# Patient Record
Sex: Female | Born: 1955 | Race: White | Hispanic: No | Marital: Married | Smoking: Never smoker
Health system: Southern US, Community
[De-identification: ages and names within clinical notes are randomized; demographics above are authoritative.]

## PROBLEM LIST (undated history)

## (undated) DIAGNOSIS — Z8489 Family history of other specified conditions: Secondary | ICD-10-CM

## (undated) DIAGNOSIS — K5790 Diverticulosis of intestine, part unspecified, without perforation or abscess without bleeding: Secondary | ICD-10-CM

## (undated) DIAGNOSIS — I6523 Occlusion and stenosis of bilateral carotid arteries: Secondary | ICD-10-CM

## (undated) DIAGNOSIS — Q2112 Patent foramen ovale: Secondary | ICD-10-CM

## (undated) DIAGNOSIS — K5792 Diverticulitis of intestine, part unspecified, without perforation or abscess without bleeding: Secondary | ICD-10-CM

## (undated) DIAGNOSIS — Z86718 Personal history of other venous thrombosis and embolism: Secondary | ICD-10-CM

## (undated) DIAGNOSIS — M1710 Unilateral primary osteoarthritis, unspecified knee: Secondary | ICD-10-CM

## (undated) DIAGNOSIS — K219 Gastro-esophageal reflux disease without esophagitis: Secondary | ICD-10-CM

## (undated) DIAGNOSIS — E782 Mixed hyperlipidemia: Secondary | ICD-10-CM

## (undated) DIAGNOSIS — N200 Calculus of kidney: Secondary | ICD-10-CM

## (undated) DIAGNOSIS — U071 COVID-19: Secondary | ICD-10-CM

## (undated) DIAGNOSIS — M179 Osteoarthritis of knee, unspecified: Secondary | ICD-10-CM

## (undated) DIAGNOSIS — K8020 Calculus of gallbladder without cholecystitis without obstruction: Secondary | ICD-10-CM

## (undated) DIAGNOSIS — T7840XA Allergy, unspecified, initial encounter: Secondary | ICD-10-CM

## (undated) DIAGNOSIS — E7800 Pure hypercholesterolemia, unspecified: Secondary | ICD-10-CM

## (undated) DIAGNOSIS — Q211 Atrial septal defect: Secondary | ICD-10-CM

## (undated) DIAGNOSIS — Z87442 Personal history of urinary calculi: Secondary | ICD-10-CM

## (undated) DIAGNOSIS — E781 Pure hyperglyceridemia: Secondary | ICD-10-CM

## (undated) HISTORY — DX: Osteoarthritis of knee, unspecified: M17.9

## (undated) HISTORY — PX: WISDOM TOOTH EXTRACTION: SHX21

## (undated) HISTORY — DX: Atrial septal defect: Q21.1

## (undated) HISTORY — DX: Calculus of gallbladder without cholecystitis without obstruction: K80.20

## (undated) HISTORY — DX: Calculus of kidney: N20.0

## (undated) HISTORY — DX: Personal history of other venous thrombosis and embolism: Z86.718

## (undated) HISTORY — DX: Patent foramen ovale: Q21.12

## (undated) HISTORY — PX: ABDOMINAL HYSTERECTOMY: SHX81

## (undated) HISTORY — DX: Personal history of transient ischemic attack (TIA), and cerebral infarction without residual deficits: Z86.73

## (undated) HISTORY — DX: Pure hyperglyceridemia: E78.1

## (undated) HISTORY — DX: Pure hypercholesterolemia, unspecified: E78.00

## (undated) HISTORY — DX: Diverticulosis of intestine, part unspecified, without perforation or abscess without bleeding: K57.90

## (undated) HISTORY — DX: Gastro-esophageal reflux disease without esophagitis: K21.9

## (undated) HISTORY — DX: Occlusion and stenosis of bilateral carotid arteries: I65.23

## (undated) HISTORY — DX: Allergy, unspecified, initial encounter: T78.40XA

## (undated) HISTORY — PX: TONSILLECTOMY: SUR1361

## (undated) HISTORY — DX: Mixed hyperlipidemia: E78.2

## (undated) HISTORY — DX: Unilateral primary osteoarthritis, unspecified knee: M17.10

## (undated) HISTORY — PX: CHOLECYSTECTOMY: SHX55

## (undated) HISTORY — DX: Diverticulitis of intestine, part unspecified, without perforation or abscess without bleeding: K57.92

## (undated) HISTORY — DX: COVID-19: U07.1

---
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
## 2001-01-30 DIAGNOSIS — N200 Calculus of kidney: Secondary | ICD-10-CM

## 2001-01-30 HISTORY — DX: Calculus of kidney: N20.0

## 2009-11-30 DIAGNOSIS — G459 Transient cerebral ischemic attack, unspecified: Secondary | ICD-10-CM | POA: Insufficient documentation

## 2009-12-14 ENCOUNTER — Encounter: Payer: Self-pay | Admitting: Cardiology

## 2009-12-19 ENCOUNTER — Encounter: Payer: Self-pay | Admitting: Cardiology

## 2009-12-20 ENCOUNTER — Encounter: Payer: Self-pay | Admitting: Cardiology

## 2009-12-22 ENCOUNTER — Encounter: Payer: Self-pay | Admitting: Cardiology

## 2009-12-24 ENCOUNTER — Encounter: Payer: Self-pay | Admitting: Cardiology

## 2009-12-29 ENCOUNTER — Encounter (INDEPENDENT_AMBULATORY_CARE_PROVIDER_SITE_OTHER): Payer: Self-pay | Admitting: *Deleted

## 2009-12-29 ENCOUNTER — Ambulatory Visit: Payer: Self-pay | Admitting: Cardiology

## 2009-12-29 ENCOUNTER — Encounter: Payer: Self-pay | Admitting: Cardiology

## 2009-12-29 DIAGNOSIS — R42 Dizziness and giddiness: Secondary | ICD-10-CM

## 2010-01-01 ENCOUNTER — Encounter: Payer: Self-pay | Admitting: Cardiology

## 2010-01-05 DIAGNOSIS — H53129 Transient visual loss, unspecified eye: Secondary | ICD-10-CM

## 2010-01-05 DIAGNOSIS — Z87442 Personal history of urinary calculi: Secondary | ICD-10-CM | POA: Insufficient documentation

## 2010-01-05 DIAGNOSIS — Z8673 Personal history of transient ischemic attack (TIA), and cerebral infarction without residual deficits: Secondary | ICD-10-CM

## 2010-01-06 ENCOUNTER — Ambulatory Visit: Payer: Self-pay | Admitting: Cardiology

## 2010-01-10 ENCOUNTER — Encounter: Payer: Self-pay | Admitting: Cardiology

## 2010-01-18 ENCOUNTER — Telehealth (INDEPENDENT_AMBULATORY_CARE_PROVIDER_SITE_OTHER): Payer: Self-pay | Admitting: *Deleted

## 2010-01-25 ENCOUNTER — Telehealth (INDEPENDENT_AMBULATORY_CARE_PROVIDER_SITE_OTHER): Payer: Self-pay | Admitting: *Deleted

## 2010-01-27 DIAGNOSIS — R002 Palpitations: Secondary | ICD-10-CM

## 2010-02-21 ENCOUNTER — Ambulatory Visit
Admission: RE | Admit: 2010-02-21 | Discharge: 2010-02-21 | Payer: Self-pay | Source: Home / Self Care | Attending: Physician Assistant | Admitting: Physician Assistant

## 2010-02-21 DIAGNOSIS — E785 Hyperlipidemia, unspecified: Secondary | ICD-10-CM | POA: Insufficient documentation

## 2010-02-26 ENCOUNTER — Encounter: Payer: Self-pay | Admitting: Cardiology

## 2010-03-01 NOTE — Procedures (Signed)
Summary: Holter and Event/ CARDIONET  Holter and Event/ CARDIONET   Imported By: Dorise Hiss 01/07/2010 16:02:46  _____________________________________________________________________  External Attachment:    Type:   Image     Comment:   External Document

## 2010-03-01 NOTE — Assessment & Plan Note (Signed)
Summary: 48 hour holter monitor  Nurse Visit  CC: holter monitor placed  Comments 48 hour holter monitor placed. patient c/o burning at the right clavicle area after electrodes placed. nurse removed and replaced in area to the right of first placement. Patient was able to tolerate this one. Nurse informed patient to use vaseline on area that was irritated and if reaction happened again,to disconnect monitor and return to our office. Instructions given and patient verbalized understanding of plan. patient instructed to return monitor without electrodes on Friday.   Orders Added: 1)  Holter Monitor [Holter Monitor]

## 2010-03-01 NOTE — Consult Note (Signed)
Summary: [facility] NEUROLOGIST [facility]  [facility] NEUROLOGIST [facility]   Imported By: Zachary George 01/05/2010 11:52:14  _____________________________________________________________________  External Attachment:    Type:   Image     Comment:   External Document

## 2010-03-01 NOTE — Letter (Signed)
Summary: [facility] D/C DR. Wende Crease  [facility] D/C DR. Wende Crease   Imported By: Zachary George 01/05/2010 11:48:13  _____________________________________________________________________  External Attachment:    Type:   Image     Comment:   External Document

## 2010-03-03 ENCOUNTER — Encounter (INDEPENDENT_AMBULATORY_CARE_PROVIDER_SITE_OTHER): Payer: Self-pay | Admitting: *Deleted

## 2010-03-03 NOTE — Assessment & Plan Note (Signed)
Summary: NPH-HOLTER F/U PER PARSON'S REQUEST   Visit Type:  Initial Consult Primary Dorman Calderwood:  Juanetta Snow, NP([facility])  CC:  wore 48 hour monitor for dizziness.  History of Present Illness: 55 yo presents for evaluation of multiple episodes of presumed TIAs.  Patient initially had an episode of vertigo on 11/3.  This was followed on 11/4 by an episode consistent with amaurosis fugax in the right eye.  On 11/21, she again had transient visual loss in the right eye and was admitted to [facility].  CT head was negative.  While in the hospital, she developed right-sided numbness and tingling.  FInally, on 11/30 she went to the [facility] ER with recurrent numbness in the right neck, arm, and hand.  She has been seen by [facility] neurology.  MRI head was unremarkable.  Echo per report showed a small PFO.  Holter monitor showed rare PACs and PVCs, no atrial fibrillation.  Patient denies exertional dyspnea or chest pain.  She does report occasional palpitations.  She occasionally feels fast, irregular heart beats.  She did not have any tachypalpitations while she was wearing the monitor.    ECG: NSR, normal  Labs (11/11): K 4.1, creatinine 0.69  Preventive Screening-Counseling & Management  Alcohol-Tobacco     Smoking Status: never  Current Medications (verified): 1)  Coumadin 5 Mg Tabs (Warfarin Sodium) .... Use As Directed 2)  Multivitamins  Tabs (Multiple Vitamin) .... Take 1 Tablet By Mouth Once A Day  Allergies: 1)  ! Pcn 2)  ! * Ivp Dye 3)  ! Codeine 4)  ! Morphine 5)  ! Adhesive Tape  Comments:  Nurse/Medical Assistant: The patient's medications and allergies were verbally reviewed with the patient and were updated in the Medication and Allergy Lists.  Past History:  Past Medical History: 1. TIAs: 4 possible TIAs in 11/11.  Worked up by [facility] neurology.  Apparently had echo with small PFO.  Venous ultrasound with no lower extremity DVT.  Holter (11/11): HR range  47-127, average 74, rare PACs and PVCs, no atrial fibrillation.   2. Kidney stones in 2003 3. Right knee osteoarthritis  Family History: Father: hx of DVTS-hx of HTN and CAD and MI in his 49s.   Social History: Works for the [employer] [place], [place].  Lives in [place] in [place].  2 adult children Married Tobacco Use - No.  Drug Use - no  Review of Systems       All systems reviewed and negative except as per HPI.   Vital Signs:  Patient profile:   55 year old female Height:      62 inches Weight:      140 pounds BMI:     25.70 Pulse rate:   78 / minute BP sitting:   124 / 83  (left arm) Cuff size:   regular  Vitals Entered By: Carlye Grippe (January 06, 2010 1:15 PM)  Nutrition Counseling: Patient's BMI is greater than 25 and therefore counseled on weight management options. CC: wore 48 hour monitor for dizziness   Physical Exam  General:  Well developed, well nourished, in no acute distress. Head:  normocephalic and atraumatic Nose:  no deformity, discharge, inflammation, or lesions Mouth:  Teeth, gums and palate normal. Oral mucosa normal. Neck:  Neck supple, no JVD. No masses, thyromegaly or abnormal cervical nodes. Lungs:  Clear bilaterally to auscultation and percussion. Heart:  Non-displaced PMI, chest non-tender; regular rate and rhythm, S1, S2 without murmurs, rubs  or gallops. Carotid upstroke normal, no bruit.  Pedals normal pulses. No edema, no varicosities. Abdomen:  Bowel sounds positive; abdomen soft and non-tender without masses, organomegaly, or hernias noted. No hepatosplenomegaly. Extremities:  No clubbing or cyanosis. Neurologic:  Alert and oriented x 3. Skin:  Intact without lesions or rashes. Psych:  Normal affect.   Impression & Recommendations:  Problem # 1:  TIA (ICD-435.9) Patient has had multiple episodes consistent with TIAs in 11/11.  Differential also includes atypical migraine.  She is being worked up by neurology at [facility]  and was found to have a small PFO on echo.  Lower extremity doppler ultrasound has shown no DVT.  She had a holter monitor for 48 hours that showed no atrial fibrillation but she did not have any of her tachypalpitations while wearing the monitor.  She says that she is scheduled to get a carotid US at [facility].  Given her history of tachypalpitations and stroke at a young age, I feel like a more definitive evaluation for atrial fibrillation is needed.  I will have her do a 3 week event monitor.  I will also check her lipids to make sure that these are under control.  I would agree with coumadin therapy.  There is no evidence for closure of the PFO outside a clincal trial.    Other Orders: T-Lipid Profile ([id number]) Cardionet/Event Monitor (Cardionet/Event)  Patient Instructions: 1)  Follow up with Gene Serpe, PA-C on Monday, February 07, 2010 at 10:20am. 2)  Have fasting lab work done at primary MD's office. Make sure they fax labs to our office for review. Do not eat or drink after midnight. 3)  We will call [facility] to request a copy of your Echo.  4)  Your physician has recommended that you wear an event monitor.  Event monitors are medical devices that record the heart's electrical activity. Doctors most often use these monitors to diagnose arrhythmias. Arrhythmias are problems with the speed or rhythm of the heartbeat. The monitor is a small, portable device. You can wear one while you do your normal daily activities. This is usually used to diagnose what is causing palpitations/syncope (passing out).

## 2010-03-03 NOTE — Assessment & Plan Note (Signed)
Summary: 1 MO F/U PER 12/8 OV W/DR. MCLEAN-JM   Visit Type:  Follow-up Primary Provider:  Juanetta Snow, NP([facility])   History of Present Illness: patient returns for a scheduled followup, following recent initial evaluation, by Dr. Shirlee Latch, back in early December. She was initially referred to [place] for further cardiac evaluation, and context of multiple episodes of presumed TIAs.  Dr. Lucien Mons recommended further evaluation with a 21 day event monitor, which was just completed last week. Strips have been reviewed, and are negative for any evidence of fibrillation/flutter. An isolated PVC is noted. Of note, the patient did have occasional palpitations, during this time frame. She has not had recurrent neurologic events, since her last OV. She is being followed by a neurologist at [facility].  A review of her previous echo at [facility] was also requested, and this indicated normal LVEF (EF greater than 55%), with no LVH, normal RVF, and no significant valvular disease. There was evidence of intra atrial flow communication, with saline contrast.   Patient is on chronic Coumadin, followed by her primary M.D. She has also since been placed on Lipitor, with recent total cholesterol 209, HDL 61, and LDL 107.  Preventive Screening-Counseling & Management  Alcohol-Tobacco     Smoking Status: never  Current Medications (verified): 1)  Coumadin 7.5 Mg Tabs (Warfarin Sodium) .... Use As Directed 2)  Multivitamins  Tabs (Multiple Vitamin) .... Take 1 Tablet By Mouth Once A Day 3)  Lipitor 20 Mg Tabs (Atorvastatin Calcium) .... Take 1 Tablet By Mouth Once A Day  Allergies (verified): 1)  ! Pcn 2)  ! * Ivp Dye 3)  ! Codeine 4)  ! Morphine 5)  ! Adhesive Tape  Comments:  Nurse/Medical Assistant: The patient's medications and allergies were reviewed with the patient and were updated in the Medication and Allergy Lists.  Tammi Romine CMA (February 21, 2010 10:03 AM)  Past History:  Past  Medical History: Last updated: 01/06/2010 1. TIAs: 4 possible TIAs in 11/11.  Worked up by [facility] neurology.  Apparently had echo with small PFO.  Venous ultrasound with no lower extremity DVT.  Holter (11/11): HR range 47-127, average 74, rare PACs and PVCs, no atrial fibrillation.   2. Kidney stones in 2003 3. Right knee osteoarthritis  Review of Systems       No fevers, chills, hemoptysis, dysphagia, melena, hematocheezia, hematuria, rash, claudication, orthopnea, pnd, pedal edema. All other systems negative.   Vital Signs:  Patient profile:   55 year old female Height:      62 inches Weight:      143 pounds BMI:     26.25 Pulse rate:   68 / minute BP sitting:   119 / 74  (left arm) Cuff size:   regular  Vitals Entered By: Fuller Plan CMA (February 21, 2010 10:03 AM)  Physical Exam  Additional Exam:  GEN: 55 year old female, no distress HEENT: NCAT,PERRLA,EOMI NECK: palpable pulses, no bruits; no JVD; no TM LUNGS: CTA bilaterally HEART: RRR (S1S2); no significant murmurs; no rubs; no gallops ABD: soft, NT; intact BS EXT: intact distal pulses; no edema SKIN: warm, dry MUSC: no obvious deformity NEURO: A/O (x3)     Impression & Recommendations:  Problem # 1:  TIA (ICD-435.9)  no definite evidence of atrial dysrhythmias, per recent 21 day event monitor. No further cardiac workup indicated, this point in time. Patient is to remain on Coumadin, followed by her primary care team, as well as her neurologist at [facility].  Of note, there is no current indication for surgical treatment of the noted PFO, as recently indicated by Dr. Shirlee Latch. We'll continue to monitor closely.  Problem # 2:  DYSLIPIDEMIA (ICD-272.4)  patient currently on Lipitor, followed by primary team. Would try to maintain an LDL target of less than 100, in the absence of documented CAD.  Patient Instructions: 1)  Your physician wants you to follow-up in: 6 months. You will receive a reminder letter in the mail  one-two months in advance. If you don't receive a letter, please call our office to schedule the follow-up appointment. Earnestine Leys) 2)  Your physician recommends that you continue on your current medications as directed. Please refer to the Current Medication list given to you today.

## 2010-03-03 NOTE — Progress Notes (Signed)
Summary: CARDIONET MONITOR STILL HAS ARRIVED TO PATIENT  Phone Note Call from Patient Call back at Home Phone [phone]   Caller: Patient Call For: nurse Summary of Call: patient called and said she still hasn't recieved her cardionet monitor. nurse informed patient that she should call cardionet directly and informed them of this and also nurse would be informed upon her arrival to office tomorrow about this issue. Initial call taken by: Carlye Grippe,  January 25, 2010 9:10 AM  Follow-up for Phone Call        Received notification from [employer] via fax that monitor was returned w/no explanation from pt. Pt states she still has not received monitor. Spoke with [employer] who states monitor was sent and then received back to them. [employer] states will check with distribution center to find error and new monitor will be sent to pt. Pt is aware of this plan. She will notify office if she does not receive monitor in next few days. Pt has f/u appt scheduled with Gene for 1/9. This was r/s today to 1/23 d/t delay in receiving monitor. Follow-up by: Cyril Loosen, RN, BSN,  January 26, 2010 10:12 AM

## 2010-03-03 NOTE — Progress Notes (Signed)
Summary: Monitor Question  Phone Note Call from Patient Call back at Home Phone [phone]   Summary of Call: Pt has not received heart monitor that was ordered on 12/9. She spoke with someone from the [employer] but has not received the monitor. Spoke with [employer]. Rep was unable to track with [employer]. She was going to contact distributor and have monitor over-nighted to pt if it had not been sent. Pt notified and verbalized understanding.  Initial call taken by: Cyril Loosen, RN, BSN,  January 18, 2010 4:11 PM

## 2010-03-09 NOTE — Procedures (Signed)
Summary: Holter and Event/ CARDIONET  Holter and Event/ CARDIONET   Imported By: Dorise Hiss 03/03/2010 10:55:56  _____________________________________________________________________  External Attachment:    Type:   Image     Comment:   External Document  Appended Document: Holter and Event/ CARDIONET no recurrence of atrial fibrillation maintaining normal sinus rhythm.  Continue current medical therapy  Appended Document: Holter and Event/ CARDIONET Patient notified by letter.

## 2010-03-09 NOTE — Letter (Signed)
Summary: Engineer, materials at [facility]  [address]   [place], [place] [address]   Phone: [phone]  Fax: [phone]        March 03, 2010 MRN: [id number]   CARMILLA GRANVILLE [address] [place], [place]  [address]   Dear Ms. Busenbark,  Your test ordered by Selena Batten has been reviewed by your physician (or physician assistant) and was found to be normal or stable. Your physician (or physician assistant) felt no changes were needed at this time.  ____ Echocardiogram  ____ Cardiac Stress Test  ____ Lab Work  ____ Peripheral vascular study of arms, legs or neck  ____ CT scan or X-ray  ____ Lung or Breathing test  __X__ Other:  Event monitor - no recurrence of atrial fibrillation.  Maintaining normal sinus rhythm, continue current medical therapy.   Thank you.   Hoover Brunette, LPN    Duane Boston, M.D., F.A.C.C. Thressa Sheller, M.D., F.A.C.C. Oneal Grout, M.D., F.A.C.C. Cheree Ditto, M.D., F.A.C.C. Daiva Nakayama, M.D., F.A.C.C. Kenney Houseman, M.D., F.A.C.C. Jeanne Ivan, PA-C

## 2010-12-09 HISTORY — PX: SHOULDER ARTHROSCOPY: SHX128

## 2011-02-20 ENCOUNTER — Ambulatory Visit: Payer: BC Managed Care – PPO | Admitting: Cardiology

## 2011-02-22 ENCOUNTER — Encounter: Payer: Self-pay | Admitting: *Deleted

## 2011-02-27 ENCOUNTER — Ambulatory Visit (INDEPENDENT_AMBULATORY_CARE_PROVIDER_SITE_OTHER): Payer: BC Managed Care – PPO | Admitting: Cardiology

## 2011-02-27 ENCOUNTER — Encounter: Payer: Self-pay | Admitting: *Deleted

## 2011-02-27 VITALS — BP 129/84 | HR 80 | Ht 62.0 in | Wt 150.0 lb

## 2011-02-27 DIAGNOSIS — Z136 Encounter for screening for cardiovascular disorders: Secondary | ICD-10-CM

## 2011-02-27 DIAGNOSIS — Q211 Atrial septal defect: Secondary | ICD-10-CM

## 2011-02-27 DIAGNOSIS — Q2112 Patent foramen ovale: Secondary | ICD-10-CM | POA: Insufficient documentation

## 2011-02-27 DIAGNOSIS — Z8673 Personal history of transient ischemic attack (TIA), and cerebral infarction without residual deficits: Secondary | ICD-10-CM

## 2011-02-27 NOTE — Progress Notes (Signed)
Tiffany Bottoms, MD, [facility] ABIM Board Certified in Adult Cardiovascular Medicine,Internal Medicine and Critical Care Medicine    CC: To followup with a history of TIAs  HPI:  The patient is a 56 year old female with a history of multiple episodes of presumed TIAs. Symptoms manifested as visual loss in the right eye as well as episodes of presyncope and dizziness. The patient was evaluated at [facility] with carotid Dopplers, echocardiogram as well as MRI of the brain eventually followed by full body MRI. She was ruled out for multiple sclerosis. She did not have a lumbar puncture done. Apparently a contrast saline study demonstrated a PFO although lower extremity Dopplers were negative for DVT. The patient was initially treated with Coumadin but then in February of 2012 she was switched to Plavix by mouth daily. Apparently her Coumadin levels were very hard to control with variable INR levels. She personally requested an evaluation for hypercoagulable state. She reports that her factor V Leiden was negative but actually did not have any other results available. No recommendations were given for PFO closure. Reported the patient had normal ejection fraction greater than 55% normal right ventricular function no significant valvular disease. She did however have a positive saline contrast study.  PMH: reviewed and listed in Problem List in Electronic Records (and see below) Past Medical History  Diagnosis Date  . History of TIAs     4 possible TIAs in 11/11.  Worked up by [facility] neurology.  Apparently had echo with small PFO. Venous ultrasound with no lower extremity DVT.  Holter (11/11): HR range 47-127, average 74, rare PACs and PVCs, no atrial fibrillation.      . Kidney stones   . Knee osteoarthritis     right  . PFO (patent foramen ovale)     Initially on Coumadin currently on Plavix  . Hypertriglyceridemia    Past Surgical History  Procedure Date  . Abdominal hysterectomy   .  Cholecystectomy   . Tonsillectomy   . Shoulder arthroscopy 12/09/10    LEFT    Allergies/SH/FHX : available in Electronic Records for review  Allergies  Allergen Reactions  . Adhesive (Tape)   . Codeine     REACTION: nausea  . Ivp Dye (Iodinated Diagnostic Agents)   . Morphine     REACTION: nausea  . Penicillins     REACTION: hives  . Lipitor (Atorvastatin Calcium) Other (See Comments)    Muscle aches  . Zocor (Simvastatin - High Dose) Other (See Comments)    Muscle aches   History   Social History  . Marital Status: Married    Spouse Name: N/A    Number of Children: N/A  . Years of Education: N/A   Occupational History  . Not on file.   Social History Main Topics  . Smoking status: Never Smoker   . Smokeless tobacco: Never Used  . Alcohol Use: No  . Drug Use: Not on file  . Sexually Active: Not on file   Other Topics Concern  . Not on file   Social History Narrative   Has 2 adult children   Family History  Problem Relation Age of Onset  . Deep vein thrombosis Father   . Hypertension Father   . Coronary artery disease Father   . Heart attack Father     Medications: Current Outpatient Prescriptions  Medication Sig Dispense Refill  . cholecalciferol (VITAMIN D) 1000 UNITS tablet Take 1,000 Units by mouth daily.      Marland Kitchen  clopidogrel (PLAVIX) 75 MG tablet Take 75 mg by mouth daily.      . fish oil-omega-3 fatty acids 1000 MG capsule Take 2 g by mouth 2 (two) times daily.      . Multiple Vitamin (MULTIVITAMIN) tablet Take 1 tablet by mouth daily.        ROS: No nausea or vomiting. No fever or chills.No melena or hematochezia.No bleeding.No claudication  Physical Exam: BP 129/84  Pulse 80  Ht 5\' 2"  (1.575 m)  Wt 150 lb (68.04 kg)  BMI 27.44 kg/m2 General: Well-nourished white female in no distress Neck: Normal carotid upstroke no carotid bruits. No thyromegaly nonnodular thyroid. JVP is 5-7 cm Lungs: Clear breath sounds bilaterally without  wheezing Cardiac: Regular rate and rhythm with normal S1-S2 no murmur rubs or gallops Vascular: No edema. Normal distal pulses bilaterally Skin: Warm and dry Physcologic: Normal affect  12lead ECG: Normal sinus rhythm no acute ischemic changes Limited bedside ECHO:N/A   Patient Active Problem List  Diagnoses  . TIA  . DIZZINESS  . PERSONAL HISTORY OF URINARY CALCULI  . DYSLIPIDEMIA  . PFO (patent foramen ovale)  . History of TIAs    PLAN    No active cardiac issues. Patient reports no chest pain or shortness of breath. Cardiac function including LV and RV function are normal  No recurrent TIAs on Plavix. Patient still followed at [facility]  Patient has been ruled out for arrhythmias with a negative Holter monitor. She reports no palpitations.  Patient is a hypertriglyceridemia and I instructed her to follow a low carbohydrate diet

## 2011-02-27 NOTE — Patient Instructions (Signed)
Your physician recommends that you schedule a follow-up appointment in: 1 year. You will receive a reminder letter in the mail about 1-2 months in advance reminding you to call our office and schedule your appointment. If you don't receive this letter, please call our office.  Your physician recommends that you continue on your current medications as directed. Please refer to the Current Medication list given to you today.

## 2011-06-09 DIAGNOSIS — K219 Gastro-esophageal reflux disease without esophagitis: Secondary | ICD-10-CM | POA: Insufficient documentation

## 2011-06-09 DIAGNOSIS — N200 Calculus of kidney: Secondary | ICD-10-CM | POA: Insufficient documentation

## 2011-06-09 DIAGNOSIS — M791 Myalgia, unspecified site: Secondary | ICD-10-CM | POA: Insufficient documentation

## 2011-06-09 DIAGNOSIS — R0989 Other specified symptoms and signs involving the circulatory and respiratory systems: Secondary | ICD-10-CM | POA: Insufficient documentation

## 2011-07-06 DIAGNOSIS — G459 Transient cerebral ischemic attack, unspecified: Secondary | ICD-10-CM | POA: Insufficient documentation

## 2012-02-29 ENCOUNTER — Ambulatory Visit: Payer: BC Managed Care – PPO | Admitting: Cardiology

## 2012-03-05 ENCOUNTER — Ambulatory Visit (INDEPENDENT_AMBULATORY_CARE_PROVIDER_SITE_OTHER): Payer: BC Managed Care – PPO | Admitting: Cardiology

## 2012-03-05 ENCOUNTER — Encounter: Payer: Self-pay | Admitting: Cardiology

## 2012-03-05 VITALS — BP 115/72 | HR 75 | Ht 62.0 in | Wt 148.0 lb

## 2012-03-05 DIAGNOSIS — Q2111 Secundum atrial septal defect: Secondary | ICD-10-CM

## 2012-03-05 DIAGNOSIS — Z8673 Personal history of transient ischemic attack (TIA), and cerebral infarction without residual deficits: Secondary | ICD-10-CM

## 2012-03-05 DIAGNOSIS — Q211 Atrial septal defect: Secondary | ICD-10-CM

## 2012-03-05 NOTE — Assessment & Plan Note (Signed)
Assessed previously as detailed above, followed at [facility]. Patient now on Plavix daily. She follows with Dr. Vivi Martens. No obvious new neurological symptoms or palpitations. ECG shows sinus rhythm today. No significant cardiac murmur.

## 2012-03-05 NOTE — Patient Instructions (Addendum)

## 2012-03-05 NOTE — Progress Notes (Signed)
Clinical Summary Tiffany Horton is a 57 y.o.female presenting for an office visit. This is my first meeting with her. She is a patient of Tiffany Horton, last seen by him in December 2011. She saw Tiffany Horton in January 2012 in followup of a 21 day event monitor which did not demonstrate any significant atrial arrhythmias, specifically no atrial fibrillation or flutter, isolated PVCs noted. She was being treated with Coumadin at that time.  History noted below including TIAs with documentation of PFO. She is followed every 6 months at [facility], sees Dr. Vivi Horton. She tells me that she was taken off of Coumadin greater than a year ago and transition to aspirin, then ultimately Plavix. She is not aware of any recurring neurological events.  She reports no major functional limitations, no palpitations. States that she is under stress at work. ECG today shows sinus rhythm.  Allergies  Allergen Reactions  . Adhesive (Tape)   . Codeine     REACTION: nausea  . Ivp Dye (Iodinated Diagnostic Agents)   . Morphine     REACTION: nausea  . Penicillins     REACTION: hives  . Lipitor (Atorvastatin Calcium) Other (See Comments)    Muscle aches  . Zocor (Simvastatin - High Dose) Other (See Comments)    Muscle aches    Current Outpatient Prescriptions  Medication Sig Dispense Refill  . cholecalciferol (VITAMIN D) 1000 UNITS tablet Take 1,000 Units by mouth daily.      . clopidogrel (PLAVIX) 75 MG tablet Take 75 mg by mouth daily.      . fish oil-omega-3 fatty acids 1000 MG capsule Take 2 g by mouth 2 (two) times daily.      . Garlic 500 MG CAPS Take 1 capsule by mouth daily.      . Multiple Vitamin (MULTIVITAMIN) tablet Take 1 tablet by mouth daily.        Past Medical History  Diagnosis Date  . History of TIAs     4 possible TIAs in 11/11.  Worked up by [facility] neurology.  Apparently had echo with small PFO. Venous ultrasound with no lower extremity DVT.  Holter (11/11): HR range 47-127, average 74,  rare PACs and PVCs, no atrial fibrillation.      . Nephrolithiasis 2003  . Knee osteoarthritis     Right  . PFO (patent foramen ovale)   . Hypertriglyceridemia     Social History Tiffany Horton reports that she has never smoked. She has never used smokeless tobacco. Tiffany Horton reports that she does not drink alcohol.  Review of Systems Negative except as outlined.  Physical Examination Filed Vitals:   03/05/12 1437  BP: 115/72  Pulse: 75   Filed Weights   03/05/12 1437  Weight: 148 lb (67.132 kg)   No acute distress. HEENT: Conjunctiva and lids normal, oropharynx clear. Neck: Supple, no elevated JVP or carotid bruits, no thyromegaly. Lungs: Clear to auscultation, nonlabored breathing at rest. Cardiac: Regular rate and rhythm, no S3 or significant systolic murmur. Abdomen: Soft, nontender, bowel sounds present. Extremities: No pitting edema, distal pulses 2+.   Problem List and Plan   PFO (patent foramen ovale) Assessed previously as detailed above, followed at [facility]. Patient now on Plavix daily. She follows with Dr. Vivi Horton. No obvious new neurological symptoms or palpitations. ECG shows sinus rhythm today. No significant cardiac murmur.  History of TIAs No reported recurring neurological events. Keep regular followup at [facility]. She has never had any documented atrial arrhythmias  by monitoring.    Tiffany Horton, M.D., F.A.C.C.

## 2012-03-05 NOTE — Assessment & Plan Note (Signed)
No reported recurring neurological events. Keep regular followup at [facility]. She has never had any documented atrial arrhythmias by monitoring.

## 2013-05-09 ENCOUNTER — Ambulatory Visit: Payer: BC Managed Care – PPO | Admitting: Cardiology

## 2013-05-27 ENCOUNTER — Ambulatory Visit (INDEPENDENT_AMBULATORY_CARE_PROVIDER_SITE_OTHER): Payer: BC Managed Care – PPO | Admitting: Cardiology

## 2013-05-27 ENCOUNTER — Encounter: Payer: Self-pay | Admitting: Cardiology

## 2013-05-27 VITALS — BP 116/75 | HR 76 | Ht 62.0 in | Wt 154.0 lb

## 2013-05-27 DIAGNOSIS — Q211 Atrial septal defect: Secondary | ICD-10-CM

## 2013-05-27 DIAGNOSIS — E785 Hyperlipidemia, unspecified: Secondary | ICD-10-CM

## 2013-05-27 DIAGNOSIS — Q2112 Patent foramen ovale: Secondary | ICD-10-CM

## 2013-05-27 DIAGNOSIS — Q2111 Secundum atrial septal defect: Secondary | ICD-10-CM

## 2013-05-27 NOTE — Assessment & Plan Note (Signed)
Previously documented at [facility] in 2011 in the setting of a TIA. She has had no further neurological events, remains on Plavix and has neurology followup at [facility]. ECG today reviewed showing sinus rhythm, she has no history of atrial arrhythmias. Followup echocardiogram will be obtained for reassessment.

## 2013-05-27 NOTE — Patient Instructions (Signed)
Your physician has requested that you have an echocardiogram. Echocardiography is a painless test that uses sound waves to create images of your heart. It provides your doctor with information about the size and shape of your heart and how well your heart's chambers and valves are working. This procedure takes approximately one hour. There are no restrictions for this procedure. Office will contact with results via phone or letter.   Continue all current medications. Your physician wants you to follow up in:  1 year.  You will receive a reminder letter in the mail one-two months in advance.  If you don't receive a letter, please call our office to schedule the follow up appointment    

## 2013-05-27 NOTE — Progress Notes (Signed)
Clinical Summary [name] is a 58 y.o.female last seen in February 2014. She reports no palpitations, chest pain, or shortness of breath. No visual changes, headache, or motor weakness. She continues on Plavix.  She continues to follow with her neurologist Dr. Salena Horton and also lipid specialist Dr. Arther Horton at [facility]. She has been lipid trial for the last few years.  Last echocardiogram done at [facility] in 2011 reported LVEF greater than 98%, grade 1 diastolic dysfunction, no major valvular abnormalities, positive saline contrast study suggesting interatrial communication. She has had no neurological events since TIA around that time, has been on Plavix.   Allergies  Allergen Reactions  . Adhesive [Tape]   . Bactrim [Sulfamethoxazole-Tmp Ds]   . Codeine     REACTION: nausea  . Ivp Dye [Iodinated Diagnostic Agents]   . Levaquin [Levofloxacin In D5w]   . Morphine     REACTION: nausea  . Penicillins     REACTION: hives  . Lipitor [Atorvastatin Calcium] Other (See Comments)    Muscle aches  . Zocor [Simvastatin - High Dose] Other (See Comments)    Muscle aches    Current Outpatient Prescriptions  Medication Sig Dispense Refill  . cholecalciferol (VITAMIN D) 1000 UNITS tablet Take 1,000 Units by mouth daily.      . clopidogrel (PLAVIX) 75 MG tablet Take 75 mg by mouth daily.      . fish oil-omega-3 fatty acids 1000 MG capsule Take 2 g by mouth 2 (two) times daily.      . Garlic 338 MG CAPS Take 1 capsule by mouth daily.      . Multiple Vitamin (MULTIVITAMIN) tablet Take 1 tablet by mouth daily.       No current facility-administered medications for this visit.    Past Medical History  Diagnosis Date  . History of TIAs     4 possible TIAs in 11/11.  Worked up by [facility] neurology.  Apparently had echo with small PFO. Venous ultrasound with no lower extremity DVT.  Holter (11/11): HR range 47-127, average 74, rare PACs and PVCs, no atrial fibrillation.      . Nephrolithiasis 2003  .  Knee osteoarthritis     Right  . PFO (patent foramen ovale)   . Hypertriglyceridemia     Social History [name] reports that she has never smoked. She has never used smokeless tobacco. [name] reports that she does not drink alcohol.  Review of Systems Negative except as outlined.  Physical Examination Filed Vitals:   05/27/13 1445  BP: 116/75  Pulse: 76   Filed Weights   05/27/13 1445  Weight: 154 lb (69.854 kg)    No acute distress.  HEENT: Conjunctiva and lids normal, oropharynx clear.  Neck: Supple, no elevated JVP or carotid bruits, no thyromegaly.  Lungs: Clear to auscultation, nonlabored breathing at rest.  Cardiac: Regular rate and rhythm, no S3 or significant systolic murmur.  Abdomen: Soft, nontender, bowel sounds present.  Extremities: No pitting edema, distal pulses 2+.   Problem List and Plan   PFO (patent foramen ovale) Previously documented at [facility] in 2011 in the setting of a TIA. She has had no further neurological events, remains on Plavix and has neurology followup at [facility]. ECG today reviewed showing sinus rhythm, she has no history of atrial arrhythmias. Followup echocardiogram will be obtained for reassessment.  DYSLIPIDEMIA Follows with Dr. Arther Horton, has been a lipid study over the last few years.    Satira Sark, M.D., F.A.C.C.

## 2013-05-27 NOTE — Assessment & Plan Note (Signed)
Follows with Dr. Arther Dames, has been a lipid study over the last few years.

## 2013-06-11 ENCOUNTER — Other Ambulatory Visit (INDEPENDENT_AMBULATORY_CARE_PROVIDER_SITE_OTHER): Payer: BC Managed Care – PPO

## 2013-06-11 ENCOUNTER — Other Ambulatory Visit: Payer: Self-pay

## 2013-06-11 DIAGNOSIS — Q2111 Secundum atrial septal defect: Secondary | ICD-10-CM

## 2013-06-11 DIAGNOSIS — Q2112 Patent foramen ovale: Secondary | ICD-10-CM

## 2013-06-11 DIAGNOSIS — Q211 Atrial septal defect: Secondary | ICD-10-CM

## 2013-06-12 ENCOUNTER — Telehealth: Payer: Self-pay | Admitting: *Deleted

## 2013-06-12 NOTE — Telephone Encounter (Signed)
Message copied by Merlene Laughter on Thu Jun 12, 2013  1:46 PM ------      Message from: MCDOWELL, Aloha Gell      Created: Wed Jun 11, 2013  1:47 PM       Reviewed. LVEF remains normal. No evidence to suggest any significant change in terms of prior documented PFO. ------

## 2013-06-12 NOTE — Telephone Encounter (Signed)
Patient informed. 

## 2014-07-14 ENCOUNTER — Ambulatory Visit (INDEPENDENT_AMBULATORY_CARE_PROVIDER_SITE_OTHER): Payer: Self-pay | Admitting: Cardiology

## 2014-07-14 ENCOUNTER — Encounter: Payer: Self-pay | Admitting: Cardiology

## 2014-07-14 VITALS — BP 130/78 | HR 60 | Ht 62.0 in | Wt 153.0 lb

## 2014-07-14 DIAGNOSIS — Q211 Atrial septal defect: Secondary | ICD-10-CM

## 2014-07-14 DIAGNOSIS — Q2112 Patent foramen ovale: Secondary | ICD-10-CM

## 2014-07-14 DIAGNOSIS — E782 Mixed hyperlipidemia: Secondary | ICD-10-CM

## 2014-07-14 DIAGNOSIS — Z136 Encounter for screening for cardiovascular disorders: Secondary | ICD-10-CM

## 2014-07-14 NOTE — Progress Notes (Signed)
Cardiology Office Note  Date: 07/14/2014   ID: Tiffany Horton, DOB December 28, 1955, MRN [id number]  PCP: Marval Regal, MD  Primary Cardiologist: Rozann Lesches, MD   Chief Complaint  Patient presents with  . PFO  . Hyperlipidemia    History of Present Illness: Tiffany Horton is a 59 y.o. female last seen in April 2015. She presents for a routine follow-up visit. Since I last saw her, she denies any new neurological symptoms, no palpitations or recurring chest pain with typical activities. 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  ECG today shows sinus rhythm with low voltage. Echocardiogram from last year is noted below.  She continues to follow at [facility] with Dr. Arther Dames in a lipid trial on an injectable agent, I presume a PSCK9 inhibitor. She also followed yearly with neurology at [facility].   Past Medical History  Diagnosis Date  . History of TIAs     4 possible TIAs in 11/11.  Worked up by [facility] neurology.  Apparently had echo with small PFO. Venous ultrasound with no lower extremity DVT.  Holter (11/11): HR range 47-127, average 74, rare PACs and PVCs, no atrial fibrillation.      . Nephrolithiasis 2003  . Knee osteoarthritis     Right  . PFO (patent foramen ovale)   . Hypertriglyceridemia      Current Outpatient Prescriptions  Medication Sig Dispense Refill  . cholecalciferol (VITAMIN D) 1000 UNITS tablet Take 1,000 Units by mouth daily.    . clopidogrel (PLAVIX) 75 MG tablet Take 75 mg by mouth daily.    . fish oil-omega-3 fatty acids 1000 MG capsule Take 2 g by mouth 2 (two) times daily.    . Garlic 443 MG CAPS Take 1 capsule by mouth daily.    . Multiple Vitamin (MULTIVITAMIN) tablet Take 1 tablet by mouth daily.    . pantoprazole (PROTONIX) 40 MG tablet Take 40 mg by mouth.      No current facility-administered medications for this visit.    Allergies:  Adhesive; Bactrim; Codeine; Ivp dye; Levaquin; Morphine; Penicillins; Lipitor; and Zocor    Social History: The patient  reports that she has never smoked. She has never used smokeless tobacco. She reports that she does not drink alcohol.   ROS:  Please see the history of present illness. Otherwise, complete review of systems is positive for none.  All other systems are reviewed and negative.   Physical Exam: VS:  BP 130/78 mmHg  Pulse 60  Ht 5\' 2"  (1.575 m)  Wt 153 lb (69.4 kg)  BMI 27.98 kg/m2  SpO2 94%, BMI Body mass index is 27.98 kg/(m^2).  Wt Readings from Last 3 Encounters:  07/14/14 153 lb (69.4 kg)  05/27/13 154 lb (69.854 kg)  03/05/12 148 lb (67.132 kg)    No acute distress.  HEENT: Conjunctiva and lids normal, oropharynx clear.  Neck: Supple, no elevated JVP or carotid bruits, no thyromegaly.  Lungs: Clear to auscultation, nonlabored breathing at rest.  Cardiac: Regular rate and rhythm, no S3 or significant systolic murmur.  Abdomen: Soft, nontender, bowel sounds present.  Extremities: No pitting edema, distal pulses 2+. Skin: Warm and dry. Musculoskeletal: No kyphosis. Neuropsychiatric: Alert and oriented 3, affect appropriate.  ECG: ECG is ordered today.   Other Studies Reviewed Today:  Echocardiogram 06/11/2013: Study Conclusions  - Left ventricle: The cavity size was normal. Wall thickness was at the upper limits of normal. Systolic function was normal. The estimated  ejection fraction was in the range of 60% to 65%. Wall motion was normal; there were no regional wall motion abnormalities. Doppler parameters are consistent with abnormal left ventricular relaxation (grade 1 diastolic dysfunction). - Mitral valve: Trivial regurgitation. - Right atrium: Central venous pressure: 4mm Hg (est). - Atrial septum: No defect or patent foramen ovale was identified by color Doppler imaging. This does not exclude small interatrial communication, particularly with history of abnormal agitated saline study indicating PFO in  the past. - Tricuspid valve: Trivial regurgitation. - Pulmonary arteries: Systolic pressure could not be accurately estimated. - Pericardium, extracardiac: A prominent pericardial fat pad was present. A trivial pericardial effusion was identified. Impressions:  - Upper normal LV wall thickness with LVEF 60-65% and grade 1 diastolic dysfunction. Trivial mitral and tricuspid regurgitation. No defect or patent foramen ovale was identified by color Doppler imaging. This does not exclude small interatrial communication, particularly with history of abnormal agitated saline study indicating PFO in the past. Normal RV size and contraction, unable to assess PASP.  Assessment and Plan:  1. History of PFO and previous TIAs. She has been clinically stable for quite some time now on Plavix. She also followed yearly with neurology at [facility]. Echo [place] from last year as noted above. She is in sinus rhythm by ECG, no history of atrial arrhythmias.  2. Hyperlipidemia, followed by Dr. Arther Dames at [facility]. She is on injectable agent, I presume a PSCK9 inhibitor.  Current medicines were reviewed with the patient today.   Orders Placed This Encounter  Procedures  . EKG 12-Lead    Disposition: FU with me in 1 year.   Signed, Satira Sark, MD, [facility] 07/14/2014 8:39 AM    [place] [facility] [place], [place], [place] [address] Phone: [phone]; Fax: [phone]

## 2014-07-14 NOTE — Patient Instructions (Signed)

## 2015-02-12 DIAGNOSIS — Z789 Other specified health status: Secondary | ICD-10-CM | POA: Insufficient documentation

## 2015-08-26 ENCOUNTER — Ambulatory Visit: Payer: Self-pay | Admitting: Cardiology

## 2015-09-23 ENCOUNTER — Encounter: Payer: Self-pay | Admitting: *Deleted

## 2015-09-23 NOTE — Progress Notes (Signed)
Cardiology Office Note  Date: 09/24/2015   ID: Tiffany Horton, DOB 1956-01-11, MRN [id number]  PCP: Marval Regal, MD  Primary Cardiologist: Rozann Lesches, MD   Chief Complaint  Patient presents with  . History of PFO    History of Present Illness: Tiffany Horton is a 60 y.o. female last seen in June 2016. She presents for a routine follow-up visit. Overall no major changes. She states that she feels some puffiness in her hands and legs towards the end of the day. She works for a [employer] in [place], has 12 hour shifts where she sits for most of the time. Tends to be more active when she is off and feels better at that time. She does not report any new neurological symptoms, no palpitations or chest pain.  I reviewed her ECG today which shows sinus rhythm with prolonged PR interval. Echocardiogram from 2015 is outlined below.  We discussed her medications. She continues to follow with Dr. Arther Dames at [facility]. She is on Praluent.  Past Medical History:  Diagnosis Date  . History of TIAs    4 possible TIAs in 11/11.  Worked up by [facility] neurology.  Apparently had echo with small PFO. Venous ultrasound with no lower extremity DVT.  Holter (11/11): HR range 47-127, average 74, rare PACs and PVCs, no atrial fibrillation.      . Hypertriglyceridemia   . Knee osteoarthritis    Right  . Nephrolithiasis 2003  . PFO (patent foramen ovale)     Past Surgical History:  Procedure Laterality Date  . ABDOMINAL HYSTERECTOMY    . CHOLECYSTECTOMY    . SHOULDER ARTHROSCOPY  12/09/10   LEFT  . TONSILLECTOMY      Current Outpatient Prescriptions  Medication Sig Dispense Refill  . Alirocumab (PRALUENT [facility]) Inject 75 mg into the skin every 21 ( twenty-one) days.    . cholecalciferol (VITAMIN D) 1000 UNITS tablet Take 1,000 Units by mouth daily.    . clopidogrel (PLAVIX) 75 MG tablet Take 75 mg by mouth daily.    . fish oil-omega-3 fatty acids 1000 MG capsule Take 2 g by mouth 2 (two) times daily.     . Garlic [id number] MG CAPS Take 1 capsule by mouth daily.    . Multiple Vitamin (MULTIVITAMIN) tablet Take 1 tablet by mouth daily.    . pantoprazole (PROTONIX) 40 MG tablet Take 40 mg by mouth.      No current facility-administered medications for this visit.    Allergies:  Adhesive [tape]; Bactrim [sulfamethoxazole-trimethoprim]; Codeine; Ivp dye [iodinated diagnostic agents]; Levaquin [levofloxacin in d5w]; Morphine; Penicillins; Lipitor [atorvastatin calcium]; and Zocor [simvastatin - high dose]   Social History: The patient  reports that she has never smoked. She has never used smokeless tobacco. She reports that she does not drink alcohol.   ROS:  Please see the history of present illness. Otherwise, complete review of systems is positive for 10 pound weight gain over the last year.  All other systems are reviewed and negative.   Physical Exam: VS:  BP (!) 130/98   Pulse 62   Ht 5\' 2"  (1.575 m)   Wt 161 lb (73 kg)   SpO2 98%   BMI 29.45 kg/m , BMI Body mass index is 29.45 kg/m.  Wt Readings from Last 3 Encounters:  09/24/15 161 lb (73 kg)  07/14/14 153 lb (69.4 kg)  05/27/13 154 lb (69.9 kg)    Overweight woman, appears comfortable at rest. HEENT: Conjunctiva and  lids normal, oropharynx clear.  Neck: Supple, no elevated JVP or carotid bruits, no thyromegaly.  Lungs: Clear to auscultation, nonlabored breathing at rest.  Cardiac: Regular rate and rhythm, no S3 or significant systolic murmur.  Abdomen: Soft, nontender, bowel sounds present.  Extremities: No pitting edema, distal pulses 2+. Skin: Warm and dry. Musculoskeletal: No kyphosis. Neuropsychiatric: Alert and oriented 3, affect appropriate.  ECG: I personally reviewed the tracing from 07/14/2014 which showed sinus bradycardia.  Recent Labwork:  June 2017: Hgb 13.5, platelets 314, Hgb A1C 5.9, TSH 0.66  Other Studies Reviewed Today:  Echocardiogram 06/11/2013: Study Conclusions  - Left ventricle: The  cavity size was normal. Wall thickness was at the upper limits of normal. Systolic function was normal. The estimated ejection fraction was in the range of 60% to 65%. Wall motion was normal; there were no regional wall motion abnormalities. Doppler parameters are consistent with abnormal left ventricular relaxation (grade 1 diastolic dysfunction). - Mitral valve: Trivial regurgitation. - Right atrium: Central venous pressure: 50mm Hg (est). - Atrial septum: No defect or patent foramen ovale was identified by color Doppler imaging. This does not exclude small interatrial communication, particularly with history of abnormal agitated saline study indicating PFO in the past. - Tricuspid valve: Trivial regurgitation. - Pulmonary arteries: Systolic pressure could not be accurately estimated. - Pericardium, extracardiac: A prominent pericardial fat pad was present. A trivial pericardial effusion was identified. Impressions:  - Upper normal LV wall thickness with LVEF 60-65% and grade 1 diastolic dysfunction. Trivial mitral and tricuspid regurgitation. No defect or patent foramen ovale was identified by color Doppler imaging. This does not exclude small interatrial communication, particularly with history of abnormal agitated saline study indicating PFO in the past. Normal RV size and contraction, unable to assess PASP.  Assessment and Plan:  1. History of PFO and prior TIAs. She continues on Plavix at this time.  2. Weight gain with reported intermittent edema in her legs and hands. May be related to relative inactivity at work, we discussed a walking plan also salt restriction. Follow-up echocardiogram as well to ensure stability in LVEF as well as RV function.  3. Familial hyperlipidemia, followed by Dr. Arther Dames at [facility] on [place] and omega-3 supplements.  Current medicines were reviewed with the patient today.   Orders Placed This  Encounter  Procedures  . EKG 12-Lead  . ECHOCARDIOGRAM COMPLETE    Disposition: Follow-up with me in one year.  Signed, Satira Sark, MD, [facility] 09/24/2015 8:33 AM    [place] [facility] [place], [place], [place] [address] Phone: [phone]; Fax: [phone]

## 2015-09-24 ENCOUNTER — Encounter: Payer: Self-pay | Admitting: Cardiology

## 2015-09-24 ENCOUNTER — Ambulatory Visit (INDEPENDENT_AMBULATORY_CARE_PROVIDER_SITE_OTHER): Payer: PRIVATE HEALTH INSURANCE | Admitting: Cardiology

## 2015-09-24 VITALS — BP 130/98 | HR 62 | Ht 62.0 in | Wt 161.0 lb

## 2015-09-24 DIAGNOSIS — Q211 Atrial septal defect: Secondary | ICD-10-CM | POA: Diagnosis not present

## 2015-09-24 DIAGNOSIS — Z8673 Personal history of transient ischemic attack (TIA), and cerebral infarction without residual deficits: Secondary | ICD-10-CM | POA: Diagnosis not present

## 2015-09-24 DIAGNOSIS — R600 Localized edema: Secondary | ICD-10-CM | POA: Diagnosis not present

## 2015-09-24 DIAGNOSIS — E782 Mixed hyperlipidemia: Secondary | ICD-10-CM

## 2015-09-24 DIAGNOSIS — Q2112 Patent foramen ovale: Secondary | ICD-10-CM

## 2015-09-24 NOTE — Patient Instructions (Signed)

## 2015-10-14 ENCOUNTER — Ambulatory Visit (INDEPENDENT_AMBULATORY_CARE_PROVIDER_SITE_OTHER): Payer: PRIVATE HEALTH INSURANCE

## 2015-10-14 ENCOUNTER — Other Ambulatory Visit: Payer: Self-pay

## 2015-10-14 DIAGNOSIS — Q2112 Patent foramen ovale: Secondary | ICD-10-CM

## 2015-10-14 DIAGNOSIS — Q211 Atrial septal defect: Secondary | ICD-10-CM | POA: Diagnosis not present

## 2015-10-18 ENCOUNTER — Telehealth: Payer: Self-pay | Admitting: *Deleted

## 2015-10-18 NOTE — Telephone Encounter (Signed)
Notes Recorded by Laurine Blazer, LPN on [id number] at [id number] AM EDT Patient notified and verbalized understanding. Copy to pmd. ------  Notes Recorded by Satira Sark, MD on 10/15/2015 at 10:31 AM EDT Results reviewed. LV and RV contraction remain normal, No obvious PFO was evident on this non-contrasted study. Overall suggests stability. Continue with same plan. A copy of this test should be forwarded to Marval Regal, MD.

## 2016-07-19 ENCOUNTER — Emergency Department (HOSPITAL_COMMUNITY)
Admission: EM | Admit: 2016-07-19 | Discharge: 2016-07-19 | Disposition: A | Discharge status: Home / Self Care | Payer: PRIVATE HEALTH INSURANCE | Attending: Emergency Medicine | Admitting: Emergency Medicine

## 2016-07-19 ENCOUNTER — Emergency Department (HOSPITAL_COMMUNITY): Payer: PRIVATE HEALTH INSURANCE

## 2016-07-19 ENCOUNTER — Encounter (HOSPITAL_COMMUNITY): Payer: Self-pay | Admitting: Emergency Medicine

## 2016-07-19 DIAGNOSIS — Z79899 Other long term (current) drug therapy: Secondary | ICD-10-CM | POA: Insufficient documentation

## 2016-07-19 DIAGNOSIS — K5732 Diverticulitis of large intestine without perforation or abscess without bleeding: Secondary | ICD-10-CM | POA: Insufficient documentation

## 2016-07-19 DIAGNOSIS — Z7902 Long term (current) use of antithrombotics/antiplatelets: Secondary | ICD-10-CM | POA: Diagnosis not present

## 2016-07-19 DIAGNOSIS — R1031 Right lower quadrant pain: Secondary | ICD-10-CM | POA: Diagnosis present

## 2016-07-19 LAB — URINALYSIS, ROUTINE W REFLEX MICROSCOPIC
BILIRUBIN URINE: NEGATIVE
GLUCOSE, UA: NEGATIVE mg/dL
HGB URINE DIPSTICK: NEGATIVE
KETONES UR: NEGATIVE mg/dL
Leukocytes, UA: NEGATIVE
Nitrite: NEGATIVE
PROTEIN: NEGATIVE mg/dL
Specific Gravity, Urine: 1.005 (ref 1.005–1.030)
pH: 7 (ref 5.0–8.0)

## 2016-07-19 LAB — COMPREHENSIVE METABOLIC PANEL
ALT: 64 U/L — AB (ref 14–54)
AST: 61 U/L — AB (ref 15–41)
Albumin: 4 g/dL (ref 3.5–5.0)
Alkaline Phosphatase: 74 U/L (ref 38–126)
Anion gap: 10 (ref 5–15)
BILIRUBIN TOTAL: 0.7 mg/dL (ref 0.3–1.2)
BUN: 14 mg/dL (ref 6–20)
CO2: 25 mmol/L (ref 22–32)
CREATININE: 0.74 mg/dL (ref 0.44–1.00)
Calcium: 9.3 mg/dL (ref 8.9–10.3)
Chloride: 103 mmol/L (ref 101–111)
GFR calc Af Amer: >60 mL/min (ref >60)
Glucose, Bld: 101 mg/dL — ABNORMAL HIGH (ref 65–99)
POTASSIUM: 3.9 mmol/L (ref 3.5–5.1)
Sodium: 138 mmol/L (ref 135–145)
TOTAL PROTEIN: 7.1 g/dL (ref 6.5–8.1)

## 2016-07-19 LAB — CBC WITH DIFFERENTIAL/PLATELET
BASOS ABS: 0 10*3/uL (ref 0.0–0.1)
Basophils Relative: 0 %
Eosinophils Absolute: 0.6 10*3/uL (ref 0.0–0.7)
Eosinophils Relative: 5 %
HEMATOCRIT: 38.1 % (ref 36.0–46.0)
Hemoglobin: 12.7 g/dL (ref 12.0–15.0)
LYMPHS PCT: 22 %
Lymphs Abs: 2.7 10*3/uL (ref 0.7–4.0)
MCH: 31.6 pg (ref 26.0–34.0)
MCHC: 33.3 g/dL (ref 30.0–36.0)
MCV: 94.8 fL (ref 78.0–100.0)
MONO ABS: 0.9 10*3/uL (ref 0.1–1.0)
Monocytes Relative: 7 %
NEUTROS ABS: 8.3 10*3/uL — AB (ref 1.7–7.7)
Neutrophils Relative %: 66 %
Platelets: 264 10*3/uL (ref 150–400)
RBC: 4.02 MIL/uL (ref 3.87–5.11)
RDW: 13.5 % (ref 11.5–15.5)
WBC: 12.5 10*3/uL — ABNORMAL HIGH (ref 4.0–10.5)

## 2016-07-19 LAB — LIPASE, BLOOD: LIPASE: 24 U/L (ref 11–51)

## 2016-07-19 MED ORDER — SODIUM CHLORIDE 0.9 % IV BOLUS (SEPSIS)
1000.0000 mL | Freq: Once | INTRAVENOUS | Status: AC
  Administered 2016-07-19: 1000 mL via INTRAVENOUS

## 2016-07-19 MED ORDER — ONDANSETRON HCL 4 MG PO TABS: 4.0000 mg | ORAL_TABLET | Freq: Three times a day (TID) | ORAL | 0 refills | Status: DC | PRN

## 2016-07-19 MED ORDER — FENTANYL CITRATE (PF) 100 MCG/2ML IJ SOLN
25.0000 ug | Freq: Once | INTRAMUSCULAR | Status: AC
  Administered 2016-07-19: 25 ug via INTRAVENOUS
  Filled 2016-07-19: qty 2

## 2016-07-19 MED ORDER — AMOXICILLIN-POT CLAVULANATE 875-125 MG PO TABS
1.0000 | ORAL_TABLET | Freq: Once | ORAL | Status: AC
  Administered 2016-07-19: 1 via ORAL
  Filled 2016-07-19: qty 1

## 2016-07-19 MED ORDER — HYDROCODONE-ACETAMINOPHEN 5-325 MG PO TABS: 2.0000 | ORAL_TABLET | ORAL | 0 refills | Status: DC | PRN

## 2016-07-19 MED ORDER — ONDANSETRON HCL 4 MG/2ML IJ SOLN
4.0000 mg | Freq: Once | INTRAMUSCULAR | Status: AC
  Administered 2016-07-19: 4 mg via INTRAVENOUS
  Filled 2016-07-19: qty 2

## 2016-07-19 MED ORDER — AMOXICILLIN-POT CLAVULANATE 875-125 MG PO TABS: 1.0000 | ORAL_TABLET | Freq: Two times a day (BID) | ORAL | 0 refills | Status: DC

## 2016-07-19 MED ORDER — HYDROCODONE-ACETAMINOPHEN 5-325 MG PO TABS: 1.0000 | ORAL_TABLET | ORAL | 0 refills | Status: DC | PRN

## 2016-07-19 MED ORDER — AMOXICILLIN-POT CLAVULANATE 875-125 MG PO TABS: 1.0000 | ORAL_TABLET | Freq: Three times a day (TID) | ORAL | 0 refills | Status: AC

## 2016-07-19 NOTE — ED Triage Notes (Signed)
rlq pain radiating to rt lower back since Sunday.  N/V intermittently since yesterday

## 2016-07-19 NOTE — ED Provider Notes (Signed)
[facility] DEPT Provider Note   CSN: [id number] Arrival date & time: 07/19/16  1907     History   Chief Complaint Chief Complaint  Patient presents with  . Abdominal Pain    HPI Tiffany Horton is a 61 y.o. female.  HPI Patient presents with abdominal pain starting Sunday. The pain became more constant yesterday. Initially in the periumbilical region. No complaints of pain mostly in the right lower quadrant. Has associated nausea and several episodes of vomiting. Has had subjective fevers and chills. Denies dysuria, hematuria, frequency or urgency. States she has some mild right-sided flank pain. Was seen in urgent care and started on Bactrim for presumed urinary tract infection. Past Medical History:  Diagnosis Date  . History of TIAs    4 possible TIAs in 11/11.  Worked up by [facility] neurology.  Apparently had echo with small PFO. Venous ultrasound with no lower extremity DVT.  Holter (11/11): HR range 47-127, average 74, rare PACs and PVCs, no atrial fibrillation.      . Hypertriglyceridemia   . Knee osteoarthritis    Right  . Nephrolithiasis 2003  . PFO (patent foramen ovale)     Patient Active Problem List   Diagnosis Date Noted  . PFO (patent foramen ovale)   . History of TIAs   . DYSLIPIDEMIA 02/21/2010    Past Surgical History:  Procedure Laterality Date  . ABDOMINAL HYSTERECTOMY    . CESAREAN SECTION    . CHOLECYSTECTOMY    . SHOULDER ARTHROSCOPY  12/09/10   LEFT  . TONSILLECTOMY      OB History    No data available       Home Medications    Prior to Admission medications   Medication Sig Start Date End Date Taking? Authorizing Provider  Alirocumab (PRALUENT Byers) Inject 75 mg into the skin every 21 ( twenty-one) days.   Yes [provider]  cholecalciferol (VITAMIN D) 1000 UNITS tablet Take 1,000 Units by mouth daily.   Yes [provider]  clopidogrel (PLAVIX) 75 MG tablet Take 75 mg by mouth daily.   Yes [provider]    fish oil-omega-3 fatty acids 1000 MG capsule Take 2 g by mouth 2 (two) times daily.   Yes [provider]  fluticasone (FLONASE) 50 MCG/ACT nasal spray Place 2 sprays into both nostrils daily as needed.  06/28/16  Yes [provider]  Garlic 308 MG CAPS Take 1 capsule by mouth daily.   Yes [provider]  Multiple Vitamin (MULTIVITAMIN) tablet Take 1 tablet by mouth daily.   Yes [provider]  pantoprazole (PROTONIX) 40 MG tablet Take 40 mg by mouth daily.  01/12/14 07/19/16 Yes [provider]  phenazopyridine (PYRIDIUM) 100 MG tablet Take 100 mg by mouth 3 (three) times daily as needed for pain.   Yes [provider]  PRALUENT 75 MG/ML SOPN Inject 1 mL into the skin every 28 (twenty-eight) days. 06/22/16  Yes [provider]  sulfamethoxazole-trimethoprim (BACTRIM DS) 800-160 MG tablet Take 1 tablet by mouth 2 (two) times daily. 5 day course starting 07-19-2016   Yes [provider]  amoxicillin-clavulanate (AUGMENTIN) 875-125 MG tablet Take 1 tablet by mouth 3 (three) times daily. One po tid x 7 days 07/19/16 07/29/16  Julianne Rice, MD  HYDROcodone-acetaminophen (NORCO) 5-325 MG tablet Take 1 tablet by mouth every 4 (four) hours as needed for severe pain. 07/19/16   Julianne Rice, MD  ondansetron (ZOFRAN) 4 MG tablet Take 1 tablet (  4 mg total) by mouth every 8 (eight) hours as needed for nausea or vomiting. 07/19/16   Julianne Rice, MD    Family History Family History  Problem Relation Age of Onset  . Deep vein thrombosis Father   . Hypertension Father   . Coronary artery disease Father   . Heart attack Father     Social History Social History  Substance Use Topics  . Smoking status: Never Smoker  . Smokeless tobacco: Never Used  . Alcohol use Yes     Comment: occ     Allergies   Bactrim [sulfamethoxazole-trimethoprim]; Codeine; Ivp dye [iodinated diagnostic agents]; Levaquin [levofloxacin in d5w];  Macrobid [nitrofurantoin macrocrystal]; Morphine; Penicillins; Adhesive [tape]; Lipitor [atorvastatin calcium]; and Zocor [simvastatin - high dose]   Review of Systems Review of Systems  Constitutional: Positive for appetite change, chills, fatigue and fever.  Eyes: Negative for visual disturbance.  Respiratory: Negative for cough and shortness of breath.   Cardiovascular: Negative for chest pain, palpitations and leg swelling.  Gastrointestinal: Positive for abdominal pain, nausea and vomiting. Negative for constipation and diarrhea.  Genitourinary: Positive for flank pain. Negative for difficulty urinating, dysuria, frequency, hematuria and pelvic pain.  Musculoskeletal: Positive for back pain. Negative for myalgias, neck pain and neck stiffness.  Skin: Negative for rash and wound.  Neurological: Negative for dizziness, light-headedness, numbness and headaches.  All other systems reviewed and are negative.    Physical Exam Updated Vital Signs BP 128/71   Pulse 91   Temp 98.1 F (36.7 C) (Oral)   Resp 18   Ht 5\' 2"  (1.575 m)   Wt 68.5 kg (151 lb)   SpO2 100%   BMI 27.62 kg/m   Physical Exam  Constitutional: She is oriented to person, place, and time. She appears well-developed and well-nourished. No distress.  HENT:  Head: Normocephalic and atraumatic.  Mouth/Throat: Oropharynx is clear and moist.  Eyes: EOM are normal. Pupils are equal, round, and reactive to light.  Neck: Normal range of motion. Neck supple.  Cardiovascular: Normal rate and regular rhythm.   Pulmonary/Chest: Effort normal and breath sounds normal.  Abdominal: Soft. Bowel sounds are normal. There is tenderness. There is rebound and guarding.  Rovsing sign present. Diffuse abdominal tenderness. Patient is very tender in the right lower quadrant. Guarding and rebound present.  Musculoskeletal: Normal range of motion. She exhibits no edema or tenderness.  No definite CVA tenderness bilaterally.    Neurological: She is alert and oriented to person, place, and time.  Moves all extremities without focal deficit. Sensation fully intact.  Skin: Skin is warm and dry. Capillary refill takes less than 2 seconds. No rash noted. No erythema.  Psychiatric: She has a normal mood and affect. Her behavior is normal.  Nursing note and vitals reviewed.    ED Treatments / Results  Labs (all labs ordered are listed, but only abnormal results are displayed) Labs Reviewed  CBC WITH DIFFERENTIAL/PLATELET - Abnormal; Notable for the following:       Result Value   WBC 12.5 (*)    Neutro Abs 8.3 (*)    All other components within normal limits  COMPREHENSIVE METABOLIC PANEL - Abnormal; Notable for the following:    Glucose, Bld 101 (*)    AST 61 (*)    ALT 64 (*)    All other components within normal limits  URINALYSIS, ROUTINE W REFLEX MICROSCOPIC - Abnormal; Notable for the following:    Color, Urine STRAW (*)    All other  components within normal limits  LIPASE, BLOOD    EKG  EKG Interpretation None       Radiology Ct Abdomen Pelvis Wo Contrast  Result Date: 07/19/2016 CLINICAL DATA:  Right lower quadrant pain. EXAM: CT ABDOMEN AND PELVIS WITHOUT CONTRAST TECHNIQUE: Multidetector CT imaging of the abdomen and pelvis was performed following the standard protocol without IV contrast. COMPARISON:  None. FINDINGS: Lower chest: Mild dependent atelectasis. No consolidation. Minimal pericardial fluid about the right ventricle. Hepatobiliary: No focal hepatic lesions on noncontrast exam. Clips in the gallbladder fossa. There is prominence of the common bile duct measuring 14 mm at the porta hepatis, with tapering to the duodenum all insertion. Pancreas: No ductal dilatation or inflammation. Spleen: Normal in size without focal abnormality. Adrenals/Urinary Tract: Normal adrenal glands. There is a 5 mm nonobstructing stone in the lower left kidney. Two nonobstructing stones in the lower right  kidney, larger measuring 4 mm. No hydronephrosis or ureteral calculi. Urinary bladder is minimally distended without wall thickening or stone. Stomach/Bowel: There is an inflamed diverticulum in the mid sigmoid colon in the mid pelvis with surrounding soft tissue stranding. Associated colonic wall thickening, no perforation or abscess. Additional noninflamed diverticula are seen in the descending and sigmoid colon. The appendix is normal. No small bowel obstruction or inflammation. Stomach is decompressed. Vascular/Lymphatic: Abdominal aorta is normal in caliber. No abdominal or pelvic adenopathy. Reproductive: Status post hysterectomy. No adnexal masses. Other: Small amount of simple free fluid in the pelvis. No loculated abscess. No free air. Musculoskeletal: There are no acute or suspicious osseous abnormalities. Degenerative change in the lower lumbar spine. IMPRESSION: 1. Acute diverticulitis of the mid sigmoid colon without complication. 2. Bilateral nonobstructing nephrolithiasis. 3. Postcholecystectomy. Prominence of the extrahepatic biliary tree is likely sequela of prior cholecystectomy. Recommend correlation for any right upper quadrant symptoms, if present, could consider MRCP. Electronically Signed   By: Jeb Levering M.D.   On: 07/19/2016 21:24    Procedures Procedures (including critical care time)  Medications Ordered in ED Medications  ondansetron [facility]) injection 4 mg (4 mg Intravenous Given 07/19/16 2012)  sodium chloride 0.9 % bolus 1,000 mL (0 mLs Intravenous Stopped 07/19/16 2216)  fentaNYL (SUBLIMAZE) injection 25 mcg (25 mcg Intravenous Given 07/19/16 2013)  amoxicillin-clavulanate (AUGMENTIN) 875-125 MG per tablet 1 tablet (1 tablet Oral Given 07/19/16 2215)     Initial Impression / Assessment and Plan / ED Course  I have reviewed the triage vital signs and the nursing notes.  Pertinent labs & imaging results that were available during my care of the patient were reviewed  by me and considered in my medical decision making (see chart for details).     Evidence of uncomplicated diverticulitis on CT. Patient thinks she may have had amoxicillin or Augmentin in the past. She has multiple allergies. No visible Augmentin orally in the emergency department. Patient tolerated well. No adverse reactions. Will discharge home with Augmentin and advised follow-up with her primary physician. Return precautions given.  Final Clinical Impressions(s) / ED Diagnoses   Final diagnoses:  Diverticulitis of sigmoid colon    New Prescriptions Current Discharge Medication List    START taking these medications   Details  amoxicillin-clavulanate (AUGMENTIN) 875-125 MG tablet Take 1 tablet by mouth 3 (three) times daily. One po tid x 7 days Qty: 21 tablet, Refills: 0    HYDROcodone-acetaminophen (NORCO) 5-325 MG tablet Take 1 tablet by mouth every 4 (four) hours as needed for severe pain. Qty: 10 tablet, Refills: 0  ondansetron (ZOFRAN) 4 MG tablet Take 1 tablet (4 mg total) by mouth every 8 (eight) hours as needed for nausea or vomiting. Qty: 12 tablet, Refills: 0         Julianne Rice, MD 07/19/16 2324

## 2016-07-24 ENCOUNTER — Encounter: Payer: Self-pay | Admitting: Internal Medicine

## 2016-09-11 ENCOUNTER — Ambulatory Visit: Payer: PRIVATE HEALTH INSURANCE | Admitting: Internal Medicine

## 2016-10-02 NOTE — Progress Notes (Signed)
Cardiology Office Note  Date: 10/03/2016   ID: Tiffany Horton, DOB 12-09-1955, MRN [id number]  PCP: Etter Sjogren, [place]  Primary Cardiologist: Rozann Lesches, MD   Chief Complaint  Patient presents with  . Cardiac follow-up    History of Present Illness: Tiffany Horton is a 61 y.o. female last seen in August 2017. She presents for a routine follow-up visit. Since last encounter she denies any palpitations, chest pain, or focal neurological symptoms. She states that she had a bout of shingles and also interval diagnosis of diverticulitis. She has pending consultation with gastroenterology.  Follow-up echocardiogram from last year is outlined below. LV and RV function remained normal and there was no obvious PFO on noncontrasted imaging. She remains on Plavix.  She continues on Praluent. She follows at [place] with Dr. Arther Dames. Recent lab work is outlined below.  I personally reviewed her ECG today which shows sinus rhythm with left atrial enlargement, decreased R wave progression.  Past Medical History:  Diagnosis Date  . Diverticulitis   . History of TIAs    4 possible TIAs in 11/11.  Worked up by [facility] neurology.  Apparently had echo with small PFO. Venous ultrasound with no lower extremity DVT.  Holter (11/11): HR range 47-127, average 74, rare PACs and PVCs, no atrial fibrillation.      . Hypercholesteremia   . Hypertriglyceridemia   . Knee osteoarthritis    Right  . Nephrolithiasis 2003  . PFO (patent foramen ovale)     Past Surgical History:  Procedure Laterality Date  . ABDOMINAL HYSTERECTOMY    . CESAREAN SECTION    . CHOLECYSTECTOMY    . SHOULDER ARTHROSCOPY  12/09/10   LEFT  . TONSILLECTOMY      Current Outpatient Prescriptions  Medication Sig Dispense Refill  . cholecalciferol (VITAMIN D) 1000 UNITS tablet Take 1,000 Units by mouth daily.    . clopidogrel (PLAVIX) 75 MG tablet Take 75 mg by mouth daily.    . fish oil-omega-3 fatty acids 1000 MG capsule Take 2 g by  mouth 2 (two) times daily.    . fluticasone (FLONASE) 50 MCG/ACT nasal spray Place 2 sprays into both nostrils daily as needed.     . Garlic 338 MG CAPS Take 1 capsule by mouth daily.    Marland Kitchen HYDROcodone-acetaminophen (NORCO) 5-325 MG tablet Take 1 tablet by mouth every 4 (four) hours as needed for severe pain. 10 tablet 0  . Multiple Vitamin (MULTIVITAMIN) tablet Take 1 tablet by mouth daily.    . ondansetron (ZOFRAN) 4 MG tablet Take 1 tablet (4 mg total) by mouth every 8 (eight) hours as needed for nausea or vomiting. 12 tablet 0  . pantoprazole (PROTONIX) 40 MG tablet Take 40 mg by mouth daily.     . phenazopyridine (PYRIDIUM) 100 MG tablet Take 100 mg by mouth 3 (three) times daily as needed for pain.    Marland Kitchen PRALUENT 75 MG/ML SOPN Inject 1 mL into the skin every 28 (twenty-eight) days.    Marland Kitchen UNKNOWN TO PATIENT ON ANTIBIOTIC FOR EAR INFECTION DOESN'T KNOW THE NAME     No current facility-administered medications for this visit.    Allergies:  Bactrim [sulfamethoxazole-trimethoprim]; Codeine; Ivp dye [iodinated diagnostic agents]; Levaquin [levofloxacin in d5w]; Macrobid [nitrofurantoin macrocrystal]; Morphine; Penicillins; Adhesive [tape]; Lipitor [atorvastatin calcium]; and Zocor [simvastatin - high dose]   Social History: The patient  reports that she has never smoked. She has never used smokeless tobacco. She reports that she drinks alcohol.  She reports that she does not use drugs.   ROS:  Please see the history of present illness. Otherwise, complete review of systems is positive for occasional abdominal pain and nausea.  All other systems are reviewed and negative.   Physical Exam: VS:  BP 118/80   Pulse 78   Ht 5\' 2"  (1.575 m)   Wt 159 lb (72.1 kg)   SpO2 98%   BMI 29.08 kg/m , BMI Body mass index is 29.08 kg/m.  Wt Readings from Last 3 Encounters:  10/03/16 159 lb (72.1 kg)  07/19/16 151 lb (68.5 kg)  09/24/15 161 lb (73 kg)    General: Overweight woman, appears comfortable at  rest. HEENT: Conjunctiva and lids normal, oropharynx clear. Neck: Supple, no elevated JVP or carotid bruits, no thyromegaly. Lungs: Clear to auscultation, nonlabored breathing at rest. Cardiac: Regular rate and rhythm, no S3 or significant systolic murmur, no pericardial rub. Abdomen: Soft, nontender, bowel sounds present. Extremities: No pitting edema, distal pulses 2+. Skin: Warm and dry. Musculoskeletal: No kyphosis. Neuropsychiatric: Alert and oriented x3, affect grossly appropriate.  ECG: I personally reviewed the tracing from 09/24/2015 which showed normal sinus rhythm with prolonged PR interval.  Recent Labwork: 07/19/2016: ALT 64; AST 61; BUN 14; Creatinine, Ser 0.74; Hemoglobin 12.7; Platelets 264; Potassium 3.9; Sodium 138  May 2018: BUN 22, creatinine 0.77, potassium 4.5, AST 20, ALT 37, cholesterol 166, triglycerides 103, HDL 67, LDL 78  Other Studies Reviewed Today:  Echocardiogram 10/14/2015: Study Conclusions  - Left ventricle: The cavity size was normal. Wall thickness was   normal. Systolic function was vigorous. The estimated ejection   fraction was in the range of 65% to 70%. Doppler parameters are   consistent with abnormal left ventricular relaxation (grade 1   diastolic dysfunction).  Assessment and Plan:  1. History of small PFO and previous TIAs diagnosed at [facility]. She has been asymptomatic on Plavix and her most recent echocardiogram without contrast did not demonstrate any significant interatrial shunt with normal LV and RV contraction.  2. Familial hyperlipidemia with statin intolerance. She remains on Praluent and follows at [facility] with Dr. Arther Dames. Recent lipid numbers are outlined above with LDL 78.  Current medicines were reviewed with the patient today.   Orders Placed This Encounter  Procedures  . EKG 12-Lead    Disposition: Follow-up in one year.  Signed, Satira Sark, MD, [facility] 10/03/2016 9:45 AM    [facility] at  [place], [place], [place] [address] Phone: [phone]; Fax: [phone]

## 2016-10-03 ENCOUNTER — Encounter: Payer: Self-pay | Admitting: Cardiology

## 2016-10-03 ENCOUNTER — Ambulatory Visit (INDEPENDENT_AMBULATORY_CARE_PROVIDER_SITE_OTHER): Payer: PRIVATE HEALTH INSURANCE | Admitting: Cardiology

## 2016-10-03 VITALS — BP 118/80 | HR 78 | Ht 62.0 in | Wt 159.0 lb

## 2016-10-03 DIAGNOSIS — E7849 Other hyperlipidemia: Secondary | ICD-10-CM

## 2016-10-03 DIAGNOSIS — Q211 Atrial septal defect: Secondary | ICD-10-CM | POA: Diagnosis not present

## 2016-10-03 DIAGNOSIS — E784 Other hyperlipidemia: Secondary | ICD-10-CM

## 2016-10-03 DIAGNOSIS — Q2112 Patent foramen ovale: Secondary | ICD-10-CM

## 2016-10-03 NOTE — Patient Instructions (Signed)
Medication Instructions:  Your physician recommends that you continue on your current medications as directed. Please refer to the Current Medication list given to you today.  Labwork: none  Testing/Procedures: none  Follow-Up: Your physician wants you to follow-up in: 1 year with Dr. Ferne Reus will receive a reminder letter in the mail two months in advance. If you don't receive a letter, please call our office to schedule the follow-up appointment.  Any Other Special Instructions Will Be Listed Below (If Applicable).  If you need a refill on your cardiac medications before your next appointment, please call your pharmacy.

## 2016-10-04 ENCOUNTER — Other Ambulatory Visit (INDEPENDENT_AMBULATORY_CARE_PROVIDER_SITE_OTHER): Payer: PRIVATE HEALTH INSURANCE

## 2016-10-04 ENCOUNTER — Encounter: Payer: Self-pay | Admitting: Internal Medicine

## 2016-10-04 ENCOUNTER — Ambulatory Visit (INDEPENDENT_AMBULATORY_CARE_PROVIDER_SITE_OTHER): Payer: PRIVATE HEALTH INSURANCE | Admitting: Internal Medicine

## 2016-10-04 VITALS — BP 110/80 | HR 56 | Ht 62.0 in | Wt 158.8 lb

## 2016-10-04 DIAGNOSIS — R935 Abnormal findings on diagnostic imaging of other abdominal regions, including retroperitoneum: Secondary | ICD-10-CM

## 2016-10-04 DIAGNOSIS — R945 Abnormal results of liver function studies: Secondary | ICD-10-CM

## 2016-10-04 DIAGNOSIS — K5732 Diverticulitis of large intestine without perforation or abscess without bleeding: Secondary | ICD-10-CM | POA: Diagnosis not present

## 2016-10-04 DIAGNOSIS — Z1211 Encounter for screening for malignant neoplasm of colon: Secondary | ICD-10-CM

## 2016-10-04 DIAGNOSIS — R7989 Other specified abnormal findings of blood chemistry: Secondary | ICD-10-CM

## 2016-10-04 LAB — HEPATIC FUNCTION PANEL
ALBUMIN: 4.6 g/dL (ref 3.5–5.2)
ALK PHOS: 73 U/L (ref 39–117)
ALT: 67 U/L — AB (ref 0–35)
AST: 45 U/L — ABNORMAL HIGH (ref 0–37)
BILIRUBIN DIRECT: 0.1 mg/dL (ref 0.0–0.3)
TOTAL PROTEIN: 7 g/dL (ref 6.0–8.3)
Total Bilirubin: 0.6 mg/dL (ref 0.2–1.2)

## 2016-10-04 MED ORDER — NA SULFATE-K SULFATE-MG SULF 17.5-3.13-1.6 GM/177ML PO SOLN: 1.0000 | Freq: Once | ORAL | 0 refills | Status: AC

## 2016-10-04 NOTE — Patient Instructions (Signed)
Your physician has requested that you go to the basement for the following lab work before leaving today:  Hepatic panel  You have been scheduled for a colonoscopy. Please follow written instructions given to you at your visit today.  Please pick up your prep supplies at the pharmacy within the next 1-3 days. If you use inhalers (even only as needed), please bring them with you on the day of your procedure. Your physician has requested that you go to www.startemmi.com and enter the access code given to you at your visit today. This web site gives a general overview about your procedure. However, you should still follow specific instructions given to you by our office regarding your preparation for the procedure.

## 2016-10-04 NOTE — Progress Notes (Signed)
HISTORY OF PRESENT ILLNESS:  Tiffany Horton is a 61 y.o. female who is referred by her primary care provider Etter Sjogren, FNP with a chief complaint of diverticulitis and needing colonoscopy. The patient denies prior GI history. She does have a history of patent foramen ovale with resultant TIA in 2011. She is on chronic Plavix therapy. Followed by cardiology here and at [facility]. She was in her usual state of health until 07/19/2016 when she presented to the emergency room with lower abdominal pain. Laboratories were remarkable for mild leukocytosis. As well as elevated hepatic transaminases (61 and 64). Other laboratories normal. CT scan revealed uncomplicated sigmoid diverticulitis. She was treated with antibiotics and improved over the course of 3-4 days. No recurrent symptoms. Grandmother with colon cancer. She denies bleeding. No unexplained weight loss. She does have chronic GERD for which she requires pantoprazole to control symptoms. No dysphagia. She does not smoke or use alcohol.  REVIEW OF SYSTEMS:  All non-GI ROS negative unless otherwise stated in the history of present illness except for arthritis  Past Medical History:  Diagnosis Date  . Diverticulitis   . History of TIAs    4 possible TIAs in 11/11.  Worked up by [facility] neurology.  Apparently had echo with small PFO. Venous ultrasound with no lower extremity DVT.  Holter (11/11): HR range 47-127, average 74, rare PACs and PVCs, no atrial fibrillation.      . Hypercholesteremia   . Hypertriglyceridemia   . Knee osteoarthritis    Right  . Nephrolithiasis 2003  . PFO (patent foramen ovale)     Past Surgical History:  Procedure Laterality Date  . ABDOMINAL HYSTERECTOMY    . CESAREAN SECTION    . CHOLECYSTECTOMY    . SHOULDER ARTHROSCOPY  12/09/10   LEFT  . TONSILLECTOMY      Social History Tiffany Horton  reports that she has never smoked. She has never used smokeless tobacco. She reports that she does not drink alcohol or use  drugs.  family history includes COPD in her mother; Colon cancer in her maternal grandmother; Coronary artery disease in her father; Deep vein thrombosis in her father; Heart attack in her father; Hypertension in her father.  Allergies  Allergen Reactions  . Bactrim [Sulfamethoxazole-Trimethoprim]     Reaction is unknown by the patient  . Codeine     REACTION: nausea  . Ivp Dye [Iodinated Diagnostic Agents]   . Levaquin [Levofloxacin In D5w]   . Macrobid [Nitrofurantoin Macrocrystal] Nausea And Vomiting  . Morphine     REACTION: nausea  . Penicillins Hives    Has patient had a PCN reaction causing immediate rash, facial/tongue/throat swelling, SOB or lightheadedness with hypotension: Yes Has patient had a PCN reaction causing severe rash involving mucus membranes or skin necrosis: No Has patient had a PCN reaction that required hospitalization: No Has patient had a PCN reaction occurring within the last 10 years: No If all of the above answers are "NO", then may proceed with Cephalosporin use.   . Adhesive [Tape] Itching and Rash  . Lipitor [Atorvastatin Calcium] Other (See Comments)    Muscle aches  . Zocor [Simvastatin - High Dose] Other (See Comments)    Muscle aches       PHYSICAL EXAMINATION: Vital signs: BP 110/80   Pulse (!) 56   Ht 5\' 2"  (1.575 m)   Wt 158 lb 12.8 oz (72 kg)   BMI 29.04 kg/m   Constitutional: generally well-appearing, no acute distress Psychiatric:  alert and oriented x3, cooperative Eyes: extraocular movements intact, anicteric, conjunctiva pink Mouth: oral pharynx moist, no lesions Neck: supple no lymphadenopathy Cardiovascular: heart regular rate and rhythm, no murmur Lungs: clear to auscultation bilaterally Abdomen: soft, nontender, nondistended, no obvious ascites, no peritoneal signs, normal bowel sounds, no organomegaly Rectal:Deferred until colonoscopy clubbing cyanosis or Extremities: no lower extremity edema bilaterally Skin: no  lesions on visible extremities Neuro: No focal deficits. Cranial nerves intact. No asterixis.    ASSESSMENT:  #1. Acute diverticulitis. Resolved with antibiotic therapy #2. Abnormal CT scan of the abdomen and pelvis consistent with acute diverticulitis #3. Chronic GERD. Symptoms controlled with PPI. No alarm features #4. Abnormal hepatic transaminases. Rule out chronic abnormality. #5. Colon cancer screening. Baseline risk   PLAN:  #1. Discussion on diverticular disease #2. Reflux precautions with attention to weight loss #3. Continue PPI. Lowest dose to control symptoms #4. Repeat LFTs to rule out chronic abnormality. If abnormal, will need additional workup to rule out significant conditions causing chronically abnormal LFTs #5. Schedule screening colonoscopy.The nature of the procedure, as well as the risks, benefits, and alternatives were carefully and thoroughly reviewed with the patient. Ample time for discussion and questions allowed. The patient understood, was satisfied, and agreed to proceed.  ADDENDUM. Repeat liver tests show persistent abnormality of hepatic transaminases without significant change. We will perform additional blood work at her next visit. She has been contacted and advised  A copy of this consultation note has been sent to Tiffany Horton

## 2016-10-05 ENCOUNTER — Other Ambulatory Visit: Payer: Self-pay

## 2016-10-05 MED ORDER — NA SULFATE-K SULFATE-MG SULF 17.5-3.13-1.6 GM/177ML PO SOLN: ORAL | 0 refills | Status: DC

## 2016-11-13 ENCOUNTER — Encounter: Payer: Self-pay | Admitting: Internal Medicine

## 2016-11-20 ENCOUNTER — Other Ambulatory Visit (INDEPENDENT_AMBULATORY_CARE_PROVIDER_SITE_OTHER): Payer: PRIVATE HEALTH INSURANCE

## 2016-11-20 ENCOUNTER — Other Ambulatory Visit: Payer: Self-pay

## 2016-11-20 ENCOUNTER — Ambulatory Visit (AMBULATORY_SURGERY_CENTER): Payer: PRIVATE HEALTH INSURANCE | Admitting: Internal Medicine

## 2016-11-20 ENCOUNTER — Encounter: Payer: Self-pay | Admitting: Internal Medicine

## 2016-11-20 VITALS — BP 137/71 | HR 77 | Temp 97.1°F | Resp 18 | Ht 62.0 in | Wt 158.0 lb

## 2016-11-20 DIAGNOSIS — K5732 Diverticulitis of large intestine without perforation or abscess without bleeding: Secondary | ICD-10-CM

## 2016-11-20 DIAGNOSIS — R945 Abnormal results of liver function studies: Secondary | ICD-10-CM | POA: Diagnosis not present

## 2016-11-20 DIAGNOSIS — D125 Benign neoplasm of sigmoid colon: Secondary | ICD-10-CM

## 2016-11-20 DIAGNOSIS — K635 Polyp of colon: Secondary | ICD-10-CM

## 2016-11-20 DIAGNOSIS — Z1211 Encounter for screening for malignant neoplasm of colon: Secondary | ICD-10-CM

## 2016-11-20 DIAGNOSIS — R935 Abnormal findings on diagnostic imaging of other abdominal regions, including retroperitoneum: Secondary | ICD-10-CM | POA: Diagnosis not present

## 2016-11-20 DIAGNOSIS — R7989 Other specified abnormal findings of blood chemistry: Secondary | ICD-10-CM

## 2016-11-20 DIAGNOSIS — D128 Benign neoplasm of rectum: Secondary | ICD-10-CM | POA: Diagnosis not present

## 2016-11-20 DIAGNOSIS — Z1212 Encounter for screening for malignant neoplasm of rectum: Secondary | ICD-10-CM | POA: Diagnosis not present

## 2016-11-20 DIAGNOSIS — D124 Benign neoplasm of descending colon: Secondary | ICD-10-CM | POA: Diagnosis not present

## 2016-11-20 LAB — IBC PANEL
Iron: 82 ug/dL (ref 42–145)
SATURATION RATIOS: 24.2 % (ref 20.0–50.0)
TRANSFERRIN: 242 mg/dL (ref 212.0–360.0)

## 2016-11-20 LAB — PROTIME-INR
INR: 1.1 ratio — ABNORMAL HIGH (ref 0.8–1.0)
Prothrombin Time: 11.6 s (ref 9.6–13.1)

## 2016-11-20 LAB — IRON: Iron: 82 ug/dL (ref 42–145)

## 2016-11-20 LAB — FERRITIN: Ferritin: 246 ng/mL (ref 10.0–291.0)

## 2016-11-20 MED ORDER — SODIUM CHLORIDE 0.9 % IV SOLN: 500.0000 mL | INTRAVENOUS | Status: DC

## 2016-11-20 NOTE — Op Note (Signed)
[place] Patient Name: Tiffany Horton Procedure Date: 11/20/2016 8:29 AM MRN: [id number] Endoscopist: Docia Chuck. Henrene Pastor , MD Age: 61 Referring MD:  Date of Birth: 08-13-1955 Gender: Female Account #: [id number] Procedure:                Colonoscopy, with cold snare polypectomy x 3 Indications:              Screening for colorectal malignant neoplasm Medicines:                Monitored Anesthesia Care Procedure:                Pre-Anesthesia Assessment:                           - Prior to the procedure, a History and Physical                            was performed, and patient medications and                            allergies were reviewed. The patient's tolerance of                            previous anesthesia was also reviewed. The risks                            and benefits of the procedure and the sedation                            options and risks were discussed with the patient.                            All questions were answered, and informed consent                            was obtained. Prior Anticoagulants: The patient has                            taken Plavix (clopidogrel), last dose was 1 day                            prior to procedure. ASA Grade Assessment: III - A                            patient with severe systemic disease. After                            reviewing the risks and benefits, the patient was                            deemed in satisfactory condition to undergo the                            procedure.  After obtaining informed consent, the colonoscope                            was passed under direct vision. Throughout the                            procedure, the patient's blood pressure, pulse, and                            oxygen saturations were monitored continuously. The                            Colonoscope was introduced through the anus and                            advanced to the the  cecum, identified by                            appendiceal orifice and ileocecal valve. The                            terminal ileum, ileocecal valve, appendiceal                            orifice, and rectum were photographed. The quality                            of the bowel preparation was excellent. The                            colonoscopy was performed without difficulty. The                            patient tolerated the procedure well. The bowel                            preparation used was SUPREP. Scope In: 8:40:32 AM Scope Out: 8:58:09 AM Scope Withdrawal Time: 0 hours 15 minutes 11 seconds  Total Procedure Duration: 0 hours 17 minutes 37 seconds  Findings:                 The terminal ileum appeared normal.                           Three polyps were found in the rectum, sigmoid                            colon and descending colon. The polyps were 1 to 5                            mm in size. These polyps were removed with a cold                            snare. Resection and retrieval were complete. The  rectal polyp was touched up with a cautery tip to                            address post-polypectomy oozing.                           Multiple diverticula were found in the left colon                            and right colon.                           Internal hemorrhoids were found during retroflexion.                           The exam was otherwise without abnormality on                            direct and retroflexion views. Complications:            No immediate complications. Estimated blood loss:                            None. Estimated Blood Loss:     Estimated blood loss: none. Impression:               - The examined portion of the ileum was normal.                           - Three 1 to 5 mm polyps in the rectum, in the                            sigmoid colon and in the descending colon, removed                             with a cold snare. Resected and retrieved.                           - Diverticulosis in the left colon and in the right                            colon.                           - Internal hemorrhoids.                           - The examination was otherwise normal on direct                            and retroflexion views. Recommendation:           - Repeat colonoscopy in 3 - 5 years for                            surveillance.                           -  Resume Plavix (clopidogrel) today at prior dose.                           - Patient has a contact number available for                            emergencies. The signs and symptoms of potential                            delayed complications were discussed with the                            patient. Return to normal activities tomorrow.                            Written discharge instructions were provided to the                            patient.                           - Resume previous diet.                           - Continue present medications.                           - Await pathology results.                           - Pleased to down to the laboratory today for                            additional blood work to further evaluate your                            abnormal liver tests                           - Please make a follow-up office appointment with                            Dr. Henrene Pastor review your workup Docia Chuck. Henrene Pastor, MD 11/20/2016 9:08:48 AM This report has been signed electronically.

## 2016-11-20 NOTE — Progress Notes (Signed)
Report to PACU, RN, vss, BBS= Clear.  

## 2016-11-20 NOTE — Progress Notes (Signed)
Called to room to assist during endoscopic procedure.  Patient ID and intended procedure confirmed with present staff. Received instructions for my participation in the procedure from the performing physician.  

## 2016-11-20 NOTE — Patient Instructions (Signed)
YOU HAD AN ENDOSCOPIC PROCEDURE TODAY AT [facility]:   Refer to the procedure report that was given to you for any specific questions about what was found during the examination.  If the procedure report does not answer your questions, please call your gastroenterologist to clarify.  If you requested that your care partner not be given the details of your procedure findings, then the procedure report has been included in a sealed envelope for you to review at your convenience later.  YOU SHOULD EXPECT: Some feelings of bloating in the abdomen. Passage of more gas than usual.  Walking can help get rid of the air that was put into your GI tract during the procedure and reduce the bloating. If you had a lower endoscopy (such as a colonoscopy or flexible sigmoidoscopy) you may notice spotting of blood in your stool or on the toilet paper. If you underwent a bowel prep for your procedure, you may not have a normal bowel movement for a few days.  Please Note:  You might notice some irritation and congestion in your nose or some drainage.  This is from the oxygen used during your procedure.  There is no need for concern and it should clear up in a day or so.  SYMPTOMS TO REPORT IMMEDIATELY:   Following lower endoscopy (colonoscopy or flexible sigmoidoscopy):  Excessive amounts of blood in the stool  Significant tenderness or worsening of abdominal pains  Swelling of the abdomen that is new, acute  Fever of 100F or higher    For urgent or emergent issues, a gastroenterologist can be reached at any hour by calling [phone].   DIET:  We do recommend a small meal at first, but then you may proceed to your regular diet.  Drink plenty of fluids but you should avoid alcoholic beverages for 24 hours.  ACTIVITY:  You should plan to take it easy for the rest of today and you should NOT DRIVE or use heavy machinery until tomorrow (because of the sedation medicines used during the test).     FOLLOW UP: Our staff will call the number listed on your records the next business day following your procedure to check on you and address any questions or concerns that you may have regarding the information given to you following your procedure. If we do not reach you, we will leave a message.  However, if you are feeling well and you are not experiencing any problems, there is no need to return our call.  We will assume that you have returned to your regular daily activities without incident.  If any biopsies were taken you will be contacted by phone or by letter within the next 1-3 weeks.  Please call us at [phone] if you have not heard about the biopsies in 3 weeks.    SIGNATURES/CONFIDENTIALITY: You and/or your care partner have signed paperwork which will be entered into your electronic medical record.  These signatures attest to the fact that that the information above on your After Visit Summary has been reviewed and is understood.  Full responsibility of the confidentiality of this discharge information lies with you and/or your care-partner.   Resume Plavix at prior diet and remainder of medications. Information given on polyps,diverticulosis and hemorrhoids. Upon discharge go to basement for lab work.

## 2016-11-21 ENCOUNTER — Telehealth: Payer: Self-pay | Admitting: *Deleted

## 2016-11-21 NOTE — Telephone Encounter (Signed)
  Follow up Call-  Call back number 11/20/2016  Post procedure Call Back phone  # #[phone] cell  Permission to leave phone message Yes  Some recent data might be hidden     Patient questions:  Do you have a fever, pain , or abdominal swelling? No. Pain Score  0 *  Have you tolerated food without any problems? Yes.    Have you been able to return to your normal activities? Yes.    Do you have any questions about your discharge instructions: Diet   No. Medications  No. Follow up visit  No.  Do you have questions or concerns about your Care? No.  Actions: * If pain score is 4 or above: No action needed, pain <4.

## 2016-11-22 LAB — ANA: ANA: NEGATIVE

## 2016-11-22 LAB — ANTI-SMOOTH MUSCLE ANTIBODY, IGG

## 2016-11-22 LAB — CERULOPLASMIN: CERULOPLASMIN: 28 mg/dL (ref 18–53)

## 2016-11-22 LAB — MITOCHONDRIAL ANTIBODIES

## 2016-11-22 LAB — HEPATITIS B SURFACE ANTIGEN: Hepatitis B Surface Ag: NONREACTIVE

## 2016-11-22 LAB — HEPATITIS B SURFACE ANTIBODY,QUALITATIVE: Hep B S Ab: NONREACTIVE

## 2016-11-22 LAB — HEPATITIS C ANTIBODY
HEP C AB: NONREACTIVE
SIGNAL TO CUT-OFF: 0.01 (ref <1.00)

## 2016-11-22 LAB — ALPHA-1-ANTITRYPSIN: A1 ANTITRYPSIN SER: 125 mg/dL (ref 83–199)

## 2016-11-22 LAB — TISSUE TRANSGLUTAMINASE, IGA: (tTG) Ab, IgA: 1 U/mL

## 2016-11-28 ENCOUNTER — Encounter: Payer: Self-pay | Admitting: Internal Medicine

## 2016-12-07 ENCOUNTER — Encounter: Payer: PRIVATE HEALTH INSURANCE | Admitting: Internal Medicine

## 2017-01-10 ENCOUNTER — Ambulatory Visit: Payer: PRIVATE HEALTH INSURANCE | Admitting: Internal Medicine

## 2017-02-28 ENCOUNTER — Ambulatory Visit: Payer: PRIVATE HEALTH INSURANCE | Admitting: Internal Medicine

## 2017-03-26 ENCOUNTER — Ambulatory Visit (INDEPENDENT_AMBULATORY_CARE_PROVIDER_SITE_OTHER): Payer: PRIVATE HEALTH INSURANCE | Admitting: Internal Medicine

## 2017-03-26 ENCOUNTER — Encounter: Payer: Self-pay | Admitting: Internal Medicine

## 2017-03-26 VITALS — BP 122/70 | HR 74 | Ht 62.0 in | Wt 158.0 lb

## 2017-03-26 DIAGNOSIS — K760 Fatty (change of) liver, not elsewhere classified: Secondary | ICD-10-CM

## 2017-03-26 DIAGNOSIS — Z86010 Personal history of colonic polyps: Secondary | ICD-10-CM | POA: Diagnosis not present

## 2017-03-26 DIAGNOSIS — K219 Gastro-esophageal reflux disease without esophagitis: Secondary | ICD-10-CM | POA: Diagnosis not present

## 2017-03-26 DIAGNOSIS — K5732 Diverticulitis of large intestine without perforation or abscess without bleeding: Secondary | ICD-10-CM

## 2017-03-26 NOTE — Progress Notes (Signed)
HISTORY OF PRESENT ILLNESS:  Tiffany Horton is a 62 y.o. female who was evaluated 10/04/2016 after having been treated for acute diverticulitis and needing colonoscopy. Other diagnoses were GERD and abnormal hepatic transaminases. See that dictation for details. Follow-up liver tests revealed persistent mild abnormalities of hepatic transaminases. Otherwise normal liver tests and globulins. Complete colonoscopy was performed 11/20/2016. The patient was found to have a normal terminal ileum. 3 polyps less than 1 cm were removed and found to be both adenomatous and hyperplastic. Follow-up in 5 years recommended. Diverticulosis without diverticulitis noted. Because of her liver test abnormalities multiple additional viral nonviral studies to assess for chronic elevation of transaminases were obtained. These returned normal or negative. She presents today for follow-up. Overall the patient has been doing well. No further problems with abdominal pain. She did experience one significant episode of nocturnal regurgitation with burning and coughing spells. She is known to have reflux for which she stays on pantoprazole 40 mg daily.  REVIEW OF SYSTEMS:  All non-GI ROS negative except for arthritis  Past Medical History:  Diagnosis Date  . Allergy   . CHF (congestive heart failure) (HCC)    PLAVIX - hx of TIA's  . Diverticulitis   . GERD (gastroesophageal reflux disease)   . History of TIAs    4 possible TIAs in 11/11.  Worked up by [facility] neurology.  Apparently had echo with small PFO. Venous ultrasound with no lower extremity DVT.  Holter (11/11): HR range 47-127, average 74, rare PACs and PVCs, no atrial fibrillation.      . Hypercholesteremia   . Hypertriglyceridemia   . Knee osteoarthritis    Right  . Nephrolithiasis 2003  . PFO (patent foramen ovale)   . Stroke ([place])    hx of tia x 4 - last tia nov 2011    Past Surgical History:  Procedure Laterality Date  . ABDOMINAL HYSTERECTOMY    . CESAREAN  SECTION    . CHOLECYSTECTOMY    . SHOULDER ARTHROSCOPY  12/09/10   LEFT  . TONSILLECTOMY    . WISDOM TOOTH EXTRACTION      Social History ANDY MOYE  reports that  has never smoked. she has never used smokeless tobacco. She reports that she does not drink alcohol or use drugs.  family history includes COPD in her mother; Colon cancer in her maternal grandmother; Coronary artery disease in her father; Deep vein thrombosis in her father; Heart attack in her father; Hypertension in her father.  Allergies  Allergen Reactions  . Bactrim [Sulfamethoxazole-Trimethoprim] Itching  . Codeine     REACTION: nausea  . Ivp Dye [Iodinated Diagnostic Agents] Hives  . Macrobid [Nitrofurantoin Macrocrystal] Nausea And Vomiting  . Morphine     REACTION: nausea  . Penicillins Hives    Has patient had a PCN reaction causing immediate rash, facial/tongue/throat swelling, SOB or lightheadedness with hypotension: Yes Has patient had a PCN reaction causing severe rash involving mucus membranes or skin necrosis: No Has patient had a PCN reaction that required hospitalization: No Has patient had a PCN reaction occurring within the last 10 years: No If all of the above answers are "NO", then may proceed with Cephalosporin use.   . Adhesive [Tape] Itching and Rash  . Levaquin [Levofloxacin In D5w] Palpitations    "Made my heart feel like it was coming out of my chest" per pt.  . Lipitor [Atorvastatin Calcium] Other (See Comments)    Muscle aches  . Zocor [Simvastatin -  High Dose] Other (See Comments)    Muscle aches       PHYSICAL EXAMINATION: Vital signs: BP 122/70   Pulse 74   Ht 5\' 2"  (1.575 m)   Wt 158 lb (71.7 kg)   BMI 28.90 kg/m   Constitutional: generally well-appearing, no acute distress Psychiatric: alert and oriented x3, cooperative Eyes: extraocular movements intact, anicteric, conjunctiva pink Mouth: oral pharynx moist, no lesions Neck: supple no lymphadenopathy Cardiovascular:  heart regular rate and rhythm, no murmur Lungs: clear to auscultation bilaterally Abdomen: soft, nontender, nondistended, no obvious ascites, no peritoneal signs, normal bowel sounds, no organomegaly Rectal: Omitted Extremities: no clubbing, cyanosis, or lower extremity edema bilaterally Skin: no lesions on visible extremities Neuro: No focal deficits. Cranial nerves intact  ASSESSMENT:  #1. Mild elevation of hepatic transaminases. Suspect mild fatty liver. Workup otherwise negative #2. History of diverticulitis. Resolved #3. Adenomatous colon polyps on colonoscopy #4. GERD. For the most part controlled. One episode of nocturnal regurgitation #5. Gen. medical problems  PLAN:  #1. Exercise #2. Weight loss #3. Reflux precautions reviewed #4. Continue PPI #5. Return to the care of PCP. PCP can monitor liver tests at least annually #6. Surveillance colonoscopy around October 2023. Interval GI follow-up as needed  25 minutes spent face-to-face with the patient. Greater than 50% a time use for counseling regarding her multiple GI diagnoses and the review illicit recommendations. Questions answered.

## 2017-03-26 NOTE — Patient Instructions (Signed)
Please follow up as needed 

## 2017-10-09 NOTE — Progress Notes (Signed)
Cardiology Office Note  Date: 10/10/2017   ID: Tiffany Horton, DOB 05-07-1955, MRN [id number]  PCP: Etter Sjogren, FNP  Primary Cardiologist: Rozann Lesches, MD    Chief Complaint  Patient presents with  . History of PFO    History of Present Illness: Tiffany Horton is a 62 y.o. female last seen in September 2018.  She is here for a routine follow-up visit.  Continues to work for [employer].  She does not report any chest pain, palpitations, or new focal neurological deficits.  She does describe a transient event that occurred at work after spinning around suddenly where she was presyncopal, almost sounds like a vasovagal event.  She follows with Dr. Arther Dames at [facility], last lipid panel from December 2018 is outlined below.  Last echocardiogram was in 2017.  I personally reviewed her ECG today which shows sinus rhythm with decreased R wave progression.  He continues on Plavix along with Praluent.  Past Medical History:  Diagnosis Date  . Allergy   . Carotid atherosclerosis, bilateral   . Diverticulitis   . GERD (gastroesophageal reflux disease)   . History of TIAs    4 possible TIAs in 11/11.  Worked up by [facility] neurology.  Apparently had echo with small PFO. Venous ultrasound with no lower extremity DVT.  Holter (11/11): HR range 47-127, average 74, rare PACs and PVCs, no atrial fibrillation.      . Hypercholesteremia   . Hypertriglyceridemia   . Knee osteoarthritis    Right  . Nephrolithiasis 2003  . PFO (patent foramen ovale)     Past Surgical History:  Procedure Laterality Date  . ABDOMINAL HYSTERECTOMY    . CESAREAN SECTION    . CHOLECYSTECTOMY    . SHOULDER ARTHROSCOPY  12/09/10   LEFT  . TONSILLECTOMY    . WISDOM TOOTH EXTRACTION      Current Outpatient Medications  Medication Sig Dispense Refill  . cholecalciferol (VITAMIN D) 1000 UNITS tablet Take 1,000 Units by mouth daily.    . clopidogrel (PLAVIX) 75 MG tablet Take 75 mg by mouth daily.      . fish oil-omega-3 fatty acids 1000 MG capsule Take 2 g by mouth 2 (two) times daily.    . fluticasone (FLONASE) 50 MCG/ACT nasal spray Place 2 sprays into both nostrils daily as needed.     . Garlic 720 MG CAPS Take 1 capsule by mouth daily.    . Multiple Vitamin (MULTIVITAMIN) tablet Take 1 tablet by mouth daily.    . ondansetron (ZOFRAN) 4 MG tablet Take 1 tablet (4 mg total) by mouth every 8 (eight) hours as needed for nausea or vomiting. 12 tablet 0  . pantoprazole (PROTONIX) 40 MG tablet Take 40 mg by mouth daily.     Marland Kitchen PRALUENT 75 MG/ML SOPN Inject 1 mL into the skin every 28 (twenty-eight) days.     No current facility-administered medications for this visit.    Allergies:  Bactrim [sulfamethoxazole-trimethoprim]; Codeine; Ivp dye [iodinated diagnostic agents]; Macrobid [nitrofurantoin macrocrystal]; Morphine; Penicillins; Adhesive [tape]; Levaquin [levofloxacin in d5w]; Lipitor [atorvastatin calcium]; and Zocor [simvastatin - high dose]   Social History: The patient  reports that she has never smoked. She has never used smokeless tobacco. She reports that she does not drink alcohol or use drugs.   ROS:  Please see the history of present illness. Otherwise, complete review of systems is positive for none.  All other systems are reviewed and negative.   Physical Exam: VS:  BP 108/78   Pulse 71   Ht 5\' 2"  (1.575 m)   Wt 159 lb (72.1 kg)   SpO2 94%   BMI 29.08 kg/m , BMI Body mass index is 29.08 kg/m.  Wt Readings from Last 3 Encounters:  10/10/17 159 lb (72.1 kg)  03/26/17 158 lb (71.7 kg)  11/20/16 158 lb (71.7 kg)    General: Patient appears comfortable at rest. HEENT: Conjunctiva and lids normal, oropharynx clear. Neck: Supple, no elevated JVP or carotid bruits, no thyromegaly. Lungs: Clear to auscultation, nonlabored breathing at rest. Cardiac: Regular rate and rhythm, no S3 or significant systolic murmur. Abdomen: Soft, nontender, bowel sounds present. Extremities:  No pitting edema, distal pulses 2+. Skin: Warm and dry. Musculoskeletal: No kyphosis. Neuropsychiatric: Alert and oriented x3, affect grossly appropriate.  ECG: I personally reviewed the tracing from 10/03/2016 which showed sinus rhythm with left atrial enlargement and decreased R wave progression.  Recent Labwork:  December 2018: cholesterol 150, triglycerides 107, HDL 66, LDL 63  Other Studies Reviewed Today:  Echocardiogram 10/14/2015: Study Conclusions  - Left ventricle: The cavity size was normal. Wall thickness was normal. Systolic function was vigorous. The estimated ejection fraction was in the range of 65% to 70%. Doppler parameters are  consistent with abnormal left ventricular relaxation (grade 1 diastolic dysfunction).  Assessment and Plan:  1.  History of small PFO and previous TIAs.  She has been clinically stable, continues to follow at [facility].  Her last echocardiogram in 2017 is outlined above.  She has had no change in cardiac examination, ECG reviewed as well.  No clear indication for follow-up echocardiogram at this time.  2.  Familial hyperlipidemia with statin intolerance.  She is on Praluent with follow-up per Dr. Arther Dames at [facility].  Last LDL 63.  3.  Mild bilateral ICA atherosclerosis based on carotid Dopplers done in 2011 at [facility].  She does not recall having any follow-up imaging.  Carotid Dopplers will be arranged.  Current medicines were reviewed with the patient today.   Orders Placed This Encounter  Procedures  . EKG 12-Lead    Disposition: Follow up in 1 year.  Signed, Satira Sark, MD, [facility] 10/10/2017 9:51 AM    [place] at [place], [place], [place] [address] Phone: [phone]; Fax: [phone]

## 2017-10-10 ENCOUNTER — Encounter: Payer: Self-pay | Admitting: Cardiology

## 2017-10-10 ENCOUNTER — Ambulatory Visit (INDEPENDENT_AMBULATORY_CARE_PROVIDER_SITE_OTHER): Payer: PRIVATE HEALTH INSURANCE | Admitting: Cardiology

## 2017-10-10 VITALS — BP 108/78 | HR 71 | Ht 62.0 in | Wt 159.0 lb

## 2017-10-10 DIAGNOSIS — I6523 Occlusion and stenosis of bilateral carotid arteries: Secondary | ICD-10-CM

## 2017-10-10 DIAGNOSIS — E7849 Other hyperlipidemia: Secondary | ICD-10-CM | POA: Diagnosis not present

## 2017-10-10 DIAGNOSIS — Z8673 Personal history of transient ischemic attack (TIA), and cerebral infarction without residual deficits: Secondary | ICD-10-CM

## 2017-10-10 DIAGNOSIS — Q211 Atrial septal defect: Secondary | ICD-10-CM | POA: Diagnosis not present

## 2017-10-10 DIAGNOSIS — Q2112 Patent foramen ovale: Secondary | ICD-10-CM

## 2017-10-10 NOTE — Patient Instructions (Addendum)
Medication Instructions:   Your physician recommends that you continue on your current medications as directed. Please refer to the Current Medication list given to you today.  Labwork:  NONE  Testing/Procedures: Your physician has requested that you have a carotid duplex. This test is an ultrasound of the carotid arteries in your neck. It looks at blood flow through these arteries that supply the brain with blood. Allow one hour for this exam. There are no restrictions or special instructions.  Follow-Up:  Your physician recommends that you schedule a follow-up appointment in: 1 year. You will receive a reminder letter in the mail in about 10 months reminding you to call and schedule your appointment. If you don't receive this letter, please contact our office.  Any Other Special Instructions Will Be Listed Below (If Applicable).  If you need a refill on your cardiac medications before your next appointment, please call your pharmacy. 

## 2017-10-25 ENCOUNTER — Ambulatory Visit (INDEPENDENT_AMBULATORY_CARE_PROVIDER_SITE_OTHER): Payer: PRIVATE HEALTH INSURANCE

## 2017-10-25 DIAGNOSIS — I6523 Occlusion and stenosis of bilateral carotid arteries: Secondary | ICD-10-CM

## 2017-10-26 ENCOUNTER — Telehealth: Payer: Self-pay | Admitting: *Deleted

## 2017-10-26 NOTE — Telephone Encounter (Signed)
-----   Message from Satira Sark, MD sent at 10/26/2017 12:53 PM EDT ----- Results reviewed.  Please let her know that the study was normal, no significant internal carotid artery stenoses. A copy of this test should be forwarded to Etter Sjogren, [place].

## 2017-10-26 NOTE — Telephone Encounter (Signed)
Patient informed. Copy sent to PCP °

## 2018-08-26 ENCOUNTER — Other Ambulatory Visit: Payer: Self-pay

## 2018-08-26 ENCOUNTER — Emergency Department (HOSPITAL_COMMUNITY)
Admission: EM | Admit: 2018-08-26 | Discharge: 2018-08-26 | Disposition: A | Discharge status: Home / Self Care | Payer: PRIVATE HEALTH INSURANCE | Attending: Emergency Medicine | Admitting: Emergency Medicine

## 2018-08-26 ENCOUNTER — Encounter (HOSPITAL_COMMUNITY): Payer: Self-pay

## 2018-08-26 ENCOUNTER — Emergency Department (HOSPITAL_COMMUNITY): Payer: PRIVATE HEALTH INSURANCE

## 2018-08-26 DIAGNOSIS — N200 Calculus of kidney: Secondary | ICD-10-CM | POA: Diagnosis not present

## 2018-08-26 DIAGNOSIS — Z79899 Other long term (current) drug therapy: Secondary | ICD-10-CM | POA: Insufficient documentation

## 2018-08-26 DIAGNOSIS — Z8673 Personal history of transient ischemic attack (TIA), and cerebral infarction without residual deficits: Secondary | ICD-10-CM | POA: Diagnosis not present

## 2018-08-26 DIAGNOSIS — Z7902 Long term (current) use of antithrombotics/antiplatelets: Secondary | ICD-10-CM | POA: Diagnosis not present

## 2018-08-26 DIAGNOSIS — R109 Unspecified abdominal pain: Secondary | ICD-10-CM | POA: Diagnosis present

## 2018-08-26 LAB — URINALYSIS, ROUTINE W REFLEX MICROSCOPIC
Bacteria, UA: NONE SEEN
Bilirubin Urine: NEGATIVE
Glucose, UA: NEGATIVE mg/dL
Ketones, ur: NEGATIVE mg/dL
Nitrite: NEGATIVE
Protein, ur: 30 mg/dL — AB
RBC / HPF: >50 RBC/hpf — ABNORMAL HIGH (ref 0–5)
Specific Gravity, Urine: 1.028 (ref 1.005–1.030)
pH: 5 (ref 5.0–8.0)

## 2018-08-26 LAB — CBC WITH DIFFERENTIAL/PLATELET
Abs Immature Granulocytes: 0.06 10*3/uL (ref 0.00–0.07)
Basophils Absolute: 0 10*3/uL (ref 0.0–0.1)
Basophils Relative: 0 %
Eosinophils Absolute: 0 10*3/uL (ref 0.0–0.5)
Eosinophils Relative: 0 %
HCT: 43.9 % (ref 36.0–46.0)
Hemoglobin: 14.6 g/dL (ref 12.0–15.0)
Immature Granulocytes: 0 %
Lymphocytes Relative: 7 %
Lymphs Abs: 1.1 10*3/uL (ref 0.7–4.0)
MCH: 31.4 pg (ref 26.0–34.0)
MCHC: 33.3 g/dL (ref 30.0–36.0)
MCV: 94.4 fL (ref 80.0–100.0)
Monocytes Absolute: 0.7 10*3/uL (ref 0.1–1.0)
Monocytes Relative: 5 %
Neutro Abs: 14.3 10*3/uL — ABNORMAL HIGH (ref 1.7–7.7)
Neutrophils Relative %: 88 %
Platelets: 291 10*3/uL (ref 150–400)
RBC: 4.65 MIL/uL (ref 3.87–5.11)
RDW: 12.6 % (ref 11.5–15.5)
WBC: 16.2 10*3/uL — ABNORMAL HIGH (ref 4.0–10.5)
nRBC: 0 % (ref 0.0–0.2)

## 2018-08-26 LAB — COMPREHENSIVE METABOLIC PANEL
ALT: 26 U/L (ref 0–44)
AST: 27 U/L (ref 15–41)
Albumin: 4.6 g/dL (ref 3.5–5.0)
Alkaline Phosphatase: 68 U/L (ref 38–126)
Anion gap: 10 (ref 5–15)
BUN: 26 mg/dL — ABNORMAL HIGH (ref 8–23)
CO2: 24 mmol/L (ref 22–32)
Calcium: 9.7 mg/dL (ref 8.9–10.3)
Chloride: 109 mmol/L (ref 98–111)
Creatinine, Ser: 0.93 mg/dL (ref 0.44–1.00)
GFR calc Af Amer: >60 mL/min (ref >60)
GFR calc non Af Amer: >60 mL/min (ref >60)
Glucose, Bld: 124 mg/dL — ABNORMAL HIGH (ref 70–99)
Potassium: 3.8 mmol/L (ref 3.5–5.1)
Sodium: 143 mmol/L (ref 135–145)
Total Bilirubin: 1.1 mg/dL (ref 0.3–1.2)
Total Protein: 7.8 g/dL (ref 6.5–8.1)

## 2018-08-26 MED ORDER — ONDANSETRON HCL 4 MG/2ML IJ SOLN
4.0000 mg | Freq: Once | INTRAMUSCULAR | Status: AC
  Administered 2018-08-26: 4 mg via INTRAVENOUS
  Filled 2018-08-26: qty 2

## 2018-08-26 MED ORDER — KETOROLAC TROMETHAMINE 30 MG/ML IJ SOLN
15.0000 mg | Freq: Once | INTRAMUSCULAR | Status: AC
  Administered 2018-08-26: 15 mg via INTRAVENOUS
  Filled 2018-08-26: qty 1

## 2018-08-26 MED ORDER — TAMSULOSIN HCL 0.4 MG PO CAPS: 0.4000 mg | ORAL_CAPSULE | Freq: Every day | ORAL | 0 refills | Status: DC

## 2018-08-26 MED ORDER — CIPROFLOXACIN HCL 500 MG PO TABS: 500.0000 mg | ORAL_TABLET | Freq: Two times a day (BID) | ORAL | 0 refills | Status: DC

## 2018-08-26 MED ORDER — HYDROCODONE-ACETAMINOPHEN 5-325 MG PO TABS: 1.0000 | ORAL_TABLET | Freq: Four times a day (QID) | ORAL | 0 refills | Status: DC | PRN

## 2018-08-26 MED ORDER — ONDANSETRON 4 MG PO TBDP: ORAL_TABLET | ORAL | 0 refills | Status: DC

## 2018-08-26 MED ORDER — HYDROMORPHONE HCL 1 MG/ML IJ SOLN
0.5000 mg | Freq: Once | INTRAMUSCULAR | Status: DC
  Filled 2018-08-26: qty 1

## 2018-08-26 NOTE — Discharge Instructions (Addendum)
Drink plenty of fluids.  Follow-up with [facility] or your own urologist later this week.  Return if any problem

## 2018-08-26 NOTE — ED Notes (Signed)
ED Provider at bedside. 

## 2018-08-26 NOTE — ED Provider Notes (Signed)
[facility] EMERGENCY DEPARTMENT Provider Note   CSN: [id number] Arrival date & time: 08/26/18  0445     History   Chief Complaint Chief Complaint  Patient presents with   Flank Pain    Left    HPI Tiffany Horton is a 63 y.o. female.     Patient with left flank pain.  Patient states the pain started yesterday all of a sudden.  And patient has a history of kidney stones  The history is provided by the patient. No language interpreter was used.  Flank Pain This is a new problem. The current episode started 3 to 5 hours ago. The problem occurs constantly. The problem has not changed since onset.Pertinent negatives include no chest pain, no abdominal pain and no headaches. Nothing aggravates the symptoms. Nothing relieves the symptoms. She has tried nothing for the symptoms. The treatment provided no relief.    Past Medical History:  Diagnosis Date   Allergy    Carotid atherosclerosis, bilateral    Diverticulitis    GERD (gastroesophageal reflux disease)    History of TIAs    4 possible TIAs in 11/11.  Worked up by [facility] neurology.  Apparently had echo with small PFO. Venous ultrasound with no lower extremity DVT.  Holter (11/11): HR range 47-127, average 74, rare PACs and PVCs, no atrial fibrillation.       Hypercholesteremia    Hypertriglyceridemia    Knee osteoarthritis    Right   Nephrolithiasis 2003   PFO (patent foramen ovale)     Patient Active Problem List   Diagnosis Date Noted   Diverticulitis of colon 11/20/2016   PFO (patent foramen ovale)    History of TIAs    DYSLIPIDEMIA 02/21/2010    Past Surgical History:  Procedure Laterality Date   ABDOMINAL HYSTERECTOMY     CESAREAN SECTION     CHOLECYSTECTOMY     SHOULDER ARTHROSCOPY  12/09/10   LEFT   TONSILLECTOMY     WISDOM TOOTH EXTRACTION       OB History   No obstetric history on file.      Home Medications    Prior to Admission medications   Medication Sig Start Date End  Date Taking? Authorizing Provider  cholecalciferol (VITAMIN D) 1000 UNITS tablet Take 1,000 Units by mouth daily.    [provider]  ciprofloxacin (CIPRO) 500 MG tablet Take 1 tablet (500 mg total) by mouth 2 (two) times daily. One po bid x 7 days 08/26/18   Milton Ferguson, MD  clopidogrel (PLAVIX) 75 MG tablet Take 75 mg by mouth daily.    [provider]  fish oil-omega-3 fatty acids 1000 MG capsule Take 2 g by mouth 2 (two) times daily.    [provider]  fluticasone (FLONASE) 50 MCG/ACT nasal spray Place 2 sprays into both nostrils daily as needed.  06/28/16   [provider]  Garlic 998 MG CAPS Take 1 capsule by mouth daily.    [provider]  HYDROcodone-acetaminophen (NORCO/VICODIN) 5-325 MG tablet Take 1 tablet by mouth every 6 (six) hours as needed. 08/26/18   Milton Ferguson, MD  Multiple Vitamin (MULTIVITAMIN) tablet Take 1 tablet by mouth daily.    [provider]  ondansetron (ZOFRAN ODT) 4 MG disintegrating tablet 4mg  ODT q4 hours prn nausea/vomit 08/26/18   Milton Ferguson, MD  ondansetron (ZOFRAN) 4 MG tablet Take 1 tablet (4 mg total) by mouth every 8 (eight) hours as needed for nausea or vomiting. 07/19/16  Julianne Rice, MD  pantoprazole (PROTONIX) 40 MG tablet Take 40 mg by mouth daily.  01/12/14 10/10/17  [provider]  PRALUENT 75 MG/ML SOPN Inject 1 mL into the skin every 28 (twenty-eight) days. 06/22/16   [provider]  tamsulosin (FLOMAX) 0.4 MG CAPS capsule Take 1 capsule (0.4 mg total) by mouth daily. 08/26/18   Milton Ferguson, MD    Family History Family History  Problem Relation Age of Onset   Deep vein thrombosis Father    Hypertension Father    Coronary artery disease Father    Heart attack Father    COPD Mother    Colon cancer Maternal Grandmother    Esophageal cancer Neg Hx    Pancreatic cancer Neg Hx    Prostate cancer Neg Hx    Rectal cancer Neg Hx    Stomach cancer Neg  Hx     Social History Social History   Tobacco Use   Smoking status: Never Smoker   Smokeless tobacco: Never Used  Substance Use Topics   Alcohol use: No    Comment: occ   Drug use: No     Allergies   Bactrim [sulfamethoxazole-trimethoprim], Codeine, Ivp dye [iodinated diagnostic agents], Macrobid [nitrofurantoin macrocrystal], Morphine, Penicillins, Adhesive [tape], Levaquin [levofloxacin in d5w], Lipitor [atorvastatin calcium], and Zocor [simvastatin - high dose]   Review of Systems Review of Systems  Constitutional: Negative for appetite change and fatigue.  HENT: Negative for congestion, ear discharge and sinus pressure.   Eyes: Negative for discharge.  Respiratory: Negative for cough.   Cardiovascular: Negative for chest pain.  Gastrointestinal: Negative for abdominal pain and diarrhea.  Genitourinary: Positive for flank pain. Negative for frequency and hematuria.  Musculoskeletal: Negative for back pain.  Skin: Negative for rash.  Neurological: Negative for seizures and headaches.  Psychiatric/Behavioral: Negative for hallucinations.     Physical Exam Updated Vital Signs BP (!) 165/80    Pulse 79    Temp 97.6 F (36.4 C) (Oral)    Resp 19    Ht 5\' 2"  (1.575 m)    Wt 70.3 kg    SpO2 100%    BMI 28.35 kg/m   Physical Exam Vitals signs and nursing note reviewed.  Constitutional:      Appearance: She is well-developed.  HENT:     Head: Normocephalic.     Nose: Nose normal.  Eyes:     General: No scleral icterus.    Conjunctiva/sclera: Conjunctivae normal.  Neck:     Musculoskeletal: Neck supple.     Thyroid: No thyromegaly.  Cardiovascular:     Rate and Rhythm: Normal rate and regular rhythm.     Heart sounds: No murmur. No friction rub. No gallop.   Pulmonary:     Breath sounds: No stridor. No wheezing or rales.  Chest:     Chest wall: No tenderness.  Abdominal:     General: There is no distension.     Tenderness: There is no abdominal  tenderness. There is no rebound.  Musculoskeletal: Normal range of motion.  Lymphadenopathy:     Cervical: No cervical adenopathy.  Skin:    Findings: No erythema or rash.  Neurological:     Mental Status: She is oriented to person, place, and time.     Motor: No abnormal muscle tone.     Coordination: Coordination normal.  Psychiatric:        Behavior: Behavior normal.      ED Treatments / Results  Labs (all labs ordered  are listed, but only abnormal results are displayed) Labs Reviewed  URINALYSIS, ROUTINE W REFLEX MICROSCOPIC - Abnormal; Notable for the following components:      Result Value   APPearance HAZY (*)    Hgb urine dipstick LARGE (*)    Protein, ur 30 (*)    Leukocytes,Ua TRACE (*)    RBC / HPF >50 (*)    All other components within normal limits  CBC WITH DIFFERENTIAL/PLATELET - Abnormal; Notable for the following components:   WBC 16.2 (*)    Neutro Abs 14.3 (*)    All other components within normal limits  COMPREHENSIVE METABOLIC PANEL - Abnormal; Notable for the following components:   Glucose, Bld 124 (*)    BUN 26 (*)    All other components within normal limits  URINE CULTURE    EKG None  Radiology Ct Renal Stone Study  Result Date: 08/26/2018 CLINICAL DATA:  Flank pain EXAM: CT ABDOMEN AND PELVIS WITHOUT CONTRAST TECHNIQUE: Multidetector CT imaging of the abdomen and pelvis was performed following the standard protocol without IV contrast. COMPARISON:  07/19/16 FINDINGS: Lower chest:  Trace pericardial fluid and/or thickening Hepatobiliary: No focal liver abnormality.Cholecystectomy which accounts for mild prominence of the common bile duct. Pancreas: Unremarkable. Spleen: Unremarkable. Adrenals/Urinary Tract: Negative adrenals. Left hydronephrosis, renal expansion, and perinephric edema secondary to a UPJ calculus measuring 5 x 3 mm. 5 mm right lower pole calculus. Collapsed urinary bladder. Stomach/Bowel: No obstruction. No appendicitis. Sigmoid  diverticulosis Vascular/Lymphatic: No acute vascular abnormality. No mass or adenopathy. Reproductive:Hysterectomy. Other: No ascites or pneumoperitoneum. Musculoskeletal: No acute abnormalities. IMPRESSION: 1. Obstructing 5 x 3 mm left UPJ calculus. 2. Right nephrolithiasis and colonic diverticulosis. Electronically Signed   By: Monte Fantasia M.D.   On: 08/26/2018 06:18    Procedures Procedures (including critical care time)  Medications Ordered in ED Medications  ondansetron (ZOFRAN) injection 4 mg (4 mg Intravenous Given 08/26/18 0855)  ketorolac (TORADOL) 30 MG/ML injection 15 mg (15 mg Intravenous Given 08/26/18 0855)     Initial Impression / Assessment and Plan / ED Course  I have reviewed the triage vital signs and the nursing notes.  Pertinent labs & imaging results that were available during my care of the patient were reviewed by me and considered in my medical decision making (see chart for details). Patient with left kidney stone.  Patient improved with treatment.  Urinalysis cultured.  Patient placed on nausea medicine Flomax pain medicine and Cipro and will follow-up with urology         Final Clinical Impressions(s) / ED Diagnoses   Final diagnoses:  Flank pain    ED Discharge Orders         Ordered    ondansetron (ZOFRAN ODT) 4 MG disintegrating tablet     08/26/18 1014    tamsulosin (FLOMAX) 0.4 MG CAPS capsule  Daily     08/26/18 1014    ciprofloxacin (CIPRO) 500 MG tablet  2 times daily     08/26/18 1014    HYDROcodone-acetaminophen (NORCO/VICODIN) 5-325 MG tablet  Every 6 hours PRN     08/26/18 1014           Milton Ferguson, MD 08/26/18 1019

## 2018-08-26 NOTE — ED Triage Notes (Signed)
Pt c/o left flank pain that started around 2am. Has had kidney stones before but said that this feels different. Denies any changes to urine. Has thrown up around  5 times since.

## 2018-08-27 ENCOUNTER — Other Ambulatory Visit: Payer: Self-pay | Admitting: Urology

## 2018-08-28 ENCOUNTER — Other Ambulatory Visit: Payer: Self-pay | Admitting: Urology

## 2018-08-28 LAB — URINE CULTURE: Culture: 10000 — AB

## 2018-08-29 ENCOUNTER — Encounter (HOSPITAL_COMMUNITY): Payer: Self-pay | Admitting: *Deleted

## 2018-08-29 ENCOUNTER — Telehealth: Payer: Self-pay

## 2018-08-29 NOTE — Progress Notes (Signed)
Spoke to patient via phone,history obtained,updated.  Bring blue folder,insurance cards,picture ID,designated driver and living will,POA, if desires (to be placed on chart). Reinforced no aspirin(instructions to hold aspirin per your doctor), ibuprofen products 72 hours prior to procedure. No vitamins or herbal medicines 7 days prior to procedure.   Follow laxative instructions provided by urologist (office) and in blue folder. Wear easy on/off clothing and no jewelry except wedding rings and ear rings. Leave all other valuables at home. Verbalizes understanding of instructions  No alcohol 24 hours prior to procedure.call md office if you have a cold,sorethroat or fever. Shower or bathe before your treatment. NPO after midnight. 

## 2018-08-29 NOTE — Telephone Encounter (Signed)
Post ED Visit - Positive Culture Follow-up  Culture report reviewed by antimicrobial stewardship pharmacist: [place] Team []  Elenor Quinones, Pharm.D. []  Heide Guile, Pharm.D., BCPS AQ-ID []  Parks Neptune, Pharm.D., BCPS []  Alycia Rossetti, Pharm.D., BCPS []  [place], Pharm.D., BCPS, AAHIVP []  Legrand Como, Pharm.D., BCPS, AAHIVP []  Salome Arnt, PharmD, BCPS []  Johnnette Gourd, PharmD, BCPS []  Hughes Better, PharmD, BCPS []  Leeroy Cha, PharmD []  Laqueta Linden, PharmD, BCPS []  Albertina Parr, [place] Team []  Leodis Sias, PharmD []  Lindell Spar, PharmD []  Royetta Asal, PharmD []  Graylin Shiver, Rph []  Rema Fendt) Glennon Mac, PharmD []  Arlyn Dunning, PharmD []  Netta Cedars, PharmD []  Dia Sitter, PharmD []  Leone Haven, PharmD []  Gretta Arab, PharmD []  Theodis Shove, PharmD []  Peggyann Juba, PharmD []  Reuel Boom, PharmD   Positive urine culture Treated with Ciprofloxacin, organism sensitive to the same and no further patient follow-up is required at this time.  Tiffany Horton 08/29/2018, 12:01 PM

## 2018-09-02 ENCOUNTER — Ambulatory Visit (HOSPITAL_COMMUNITY): Payer: PRIVATE HEALTH INSURANCE

## 2018-09-02 ENCOUNTER — Ambulatory Visit (HOSPITAL_COMMUNITY)
Admission: RE | Admit: 2018-09-02 | Discharge: 2018-09-02 | Disposition: A | Discharge status: Home / Self Care | Payer: PRIVATE HEALTH INSURANCE | Attending: Urology | Admitting: Urology

## 2018-09-02 ENCOUNTER — Encounter (HOSPITAL_COMMUNITY)
Admission: RE | Disposition: A | Discharge status: Home / Self Care | Payer: Self-pay | Source: Home / Self Care | Attending: Urology

## 2018-09-02 ENCOUNTER — Other Ambulatory Visit: Payer: Self-pay

## 2018-09-02 ENCOUNTER — Encounter (HOSPITAL_COMMUNITY): Payer: Self-pay | Admitting: *Deleted

## 2018-09-02 DIAGNOSIS — N202 Calculus of kidney with calculus of ureter: Secondary | ICD-10-CM | POA: Insufficient documentation

## 2018-09-02 DIAGNOSIS — Z7902 Long term (current) use of antithrombotics/antiplatelets: Secondary | ICD-10-CM | POA: Diagnosis not present

## 2018-09-02 DIAGNOSIS — Z885 Allergy status to narcotic agent status: Secondary | ICD-10-CM | POA: Insufficient documentation

## 2018-09-02 DIAGNOSIS — Z880 Allergy status to penicillin: Secondary | ICD-10-CM | POA: Insufficient documentation

## 2018-09-02 DIAGNOSIS — Q211 Atrial septal defect: Secondary | ICD-10-CM | POA: Diagnosis not present

## 2018-09-02 DIAGNOSIS — Z8249 Family history of ischemic heart disease and other diseases of the circulatory system: Secondary | ICD-10-CM | POA: Diagnosis not present

## 2018-09-02 DIAGNOSIS — Z8673 Personal history of transient ischemic attack (TIA), and cerebral infarction without residual deficits: Secondary | ICD-10-CM | POA: Diagnosis not present

## 2018-09-02 DIAGNOSIS — Z881 Allergy status to other antibiotic agents status: Secondary | ICD-10-CM | POA: Insufficient documentation

## 2018-09-02 DIAGNOSIS — Z888 Allergy status to other drugs, medicaments and biological substances status: Secondary | ICD-10-CM | POA: Insufficient documentation

## 2018-09-02 DIAGNOSIS — I6523 Occlusion and stenosis of bilateral carotid arteries: Secondary | ICD-10-CM | POA: Diagnosis not present

## 2018-09-02 DIAGNOSIS — N201 Calculus of ureter: Secondary | ICD-10-CM

## 2018-09-02 HISTORY — PX: EXTRACORPOREAL SHOCK WAVE LITHOTRIPSY: SHX1557

## 2018-09-02 HISTORY — DX: Personal history of urinary calculi: Z87.442

## 2018-09-02 SURGERY — LITHOTRIPSY, ESWL
Anesthesia: LOCAL | Laterality: Left

## 2018-09-02 MED ORDER — CIPROFLOXACIN HCL 500 MG PO TABS
500.0000 mg | ORAL_TABLET | Freq: Once | ORAL | Status: AC
  Administered 2018-09-02: 500 mg via ORAL
  Filled 2018-09-02: qty 1

## 2018-09-02 MED ORDER — DIPHENHYDRAMINE HCL 25 MG PO CAPS
25.0000 mg | ORAL_CAPSULE | ORAL | Status: AC
  Administered 2018-09-02: 07:00:00 25 mg via ORAL
  Filled 2018-09-02: qty 1

## 2018-09-02 MED ORDER — SODIUM CHLORIDE 0.9 % IV SOLN
INTRAVENOUS | Status: DC
  Administered 2018-09-02: 07:00:00 via INTRAVENOUS

## 2018-09-02 MED ORDER — DIAZEPAM 5 MG PO TABS
10.0000 mg | ORAL_TABLET | ORAL | Status: AC
  Administered 2018-09-02: 10 mg via ORAL
  Filled 2018-09-02: qty 2

## 2018-09-02 NOTE — H&P (Signed)
Tiffany Horton is an 63 y.o. female.    Chief Complaint: Pre-Op LEFT Shockwave Lithotripsy  HPI:   1- LEFT Ureteral Stone - 48mm left UPJ stone with mild hydro by ER CT 07/2018 on eval flank pain. Stone at leel of L4 transverse process, SSD 12cm, 400HU. Prior CX with scant enterococcus treated with fluoroquinolone.  Today " Tiffany Horton" is seen to proceed with LEFT SWL for proximal stone. No interval fevers. She has held plavix. KUB today shows distal progression, now UVJ.     Past Medical History:  Diagnosis Date  . Allergy   . Carotid atherosclerosis, bilateral   . Diverticulitis   . Family history of adverse reaction to anesthesia    father- could not wake up after open heart surgery  . GERD (gastroesophageal reflux disease)   . History of kidney stones   . History of TIAs    4 possible TIAs in 11/11.  Worked up by [facility] neurology.  Apparently had echo with small PFO. Venous ultrasound with no lower extremity DVT.  Holter (11/11): HR range 47-127, average 74, rare PACs and PVCs, no atrial fibrillation.      . Hypercholesteremia   . Hypertriglyceridemia   . Knee osteoarthritis    Right  . Nephrolithiasis 2003  . PFO (patent foramen ovale)     Past Surgical History:  Procedure Laterality Date  . ABDOMINAL HYSTERECTOMY    . CESAREAN SECTION    . CHOLECYSTECTOMY    . SHOULDER ARTHROSCOPY  12/09/10   LEFT  . TONSILLECTOMY    . WISDOM TOOTH EXTRACTION      Family History  Problem Relation Age of Onset  . Deep vein thrombosis Father   . Hypertension Father   . Coronary artery disease Father   . Heart attack Father   . COPD Mother   . Colon cancer Maternal Grandmother   . Esophageal cancer Neg Hx   . Pancreatic cancer Neg Hx   . Prostate cancer Neg Hx   . Rectal cancer Neg Hx   . Stomach cancer Neg Hx    Social History:  reports that she has never smoked. She has never used smokeless tobacco. She reports that she does not drink alcohol or use drugs.  Allergies:  Allergies   Allergen Reactions  . Bactrim [Sulfamethoxazole-Trimethoprim] Itching  . Codeine     REACTION: nausea  . Ivp Dye [Iodinated Diagnostic Agents] Hives  . Macrobid [Nitrofurantoin Macrocrystal] Nausea And Vomiting  . Morphine     REACTION: nausea  . Penicillins Hives    Has patient had a PCN reaction causing immediate rash, facial/tongue/throat swelling, SOB or lightheadedness with hypotension: Yes Has patient had a PCN reaction causing severe rash involving mucus membranes or skin necrosis: No Has patient had a PCN reaction that required hospitalization: No Has patient had a PCN reaction occurring within the last 10 years: No If all of the above answers are "NO", then may proceed with Cephalosporin use.   This reaction was 30 years ago. Has had amoxicillan   . Adhesive [Tape] Itching and Rash  . Levaquin [Levofloxacin In D5w] Palpitations    "Made my heart feel like it was coming out of my chest" per pt.  . Lipitor [Atorvastatin Calcium] Other (See Comments)    Muscle aches  . Zocor [Simvastatin - High Dose] Other (See Comments)    Muscle aches    No medications prior to admission.    No results found for this or any previous visit (from  the past 48 hour(s)). No results found.  Review of Systems  Constitutional: Negative.  Negative for chills and fever.  HENT: Negative.   Eyes: Negative.   Respiratory: Negative.   Cardiovascular: Negative.   Gastrointestinal: Negative.   Genitourinary: Positive for flank pain.  Skin: Negative.     There were no vitals taken for this visit. Physical Exam  Constitutional: She appears well-developed.  HENT:  Head: Normocephalic.  Eyes: Pupils are equal, round, and reactive to light.  Neck: Normal range of motion.  Cardiovascular: Normal rate.  Respiratory: Effort normal.  GI: Soft.  Genitourinary:    Genitourinary Comments: Very mild left CVAT at present   Neurological: She is alert.  Skin: Skin is warm.  Psychiatric: She has a  normal mood and affect.     Assessment/Plan  Proceed as planned with LEFT shockwave lithotripsy. Risks, benefits, alternatives, expected per-treatment course discussed.   Alexis Frock, MD 09/02/2018, 5:11 AM

## 2018-09-02 NOTE — Brief Op Note (Signed)
09/02/2018  8:33 AM  PATIENT:  Tiffany Horton  63 y.o. female  PRE-OPERATIVE DIAGNOSIS:  LEFT MID URETERAL STONE  POST-OPERATIVE DIAGNOSIS:  * No post-op diagnosis entered *  PROCEDURE:  Procedure(s): EXTRACORPOREAL SHOCK WAVE LITHOTRIPSY (ESWL) (Left)  SURGEON:  Surgeon(s) and Role:    * Alexis Frock, MD - Primary  PHYSICIAN ASSISTANT:   ASSISTANTS: none   ANESTHESIA:   MAC  EBL:  minimal   BLOOD ADMINISTERED:none  DRAINS: none   LOCAL MEDICATIONS USED:  NONE  SPECIMEN:  No Specimen  DISPOSITION OF SPECIMEN:  N/A  COUNTS:  YES  TOURNIQUET:  * No tourniquets in log *  DICTATION: .Note written in paper chart  PLAN OF CARE: Discharge to home after PACU  PATIENT DISPOSITION:  Short Stay   Delay start of Pharmacological VTE agent (>24hrs) due to surgical blood loss or risk of bleeding: yes

## 2018-09-02 NOTE — Discharge Instructions (Signed)
1 - You may have urinary urgency (bladder spasms), pass small stone fragments, and bloody urine on / off for up to 2 weeks. This is normal. ° °2 - Call MD or go to ER for fever >102, severe pain / nausea / vomiting not relieved by medications, or acute change in medical status ° °

## 2018-09-03 ENCOUNTER — Encounter (HOSPITAL_COMMUNITY): Payer: Self-pay | Admitting: Urology

## 2018-11-08 ENCOUNTER — Telehealth: Payer: Self-pay | Admitting: Cardiology

## 2018-11-08 NOTE — Telephone Encounter (Signed)
Virtual Visit Pre-Appointment Phone Call  "(Name), I am calling you today to discuss your upcoming appointment. We are currently trying to limit exposure to the virus that causes COVID-19 by seeing patients at home rather than in the office."  1. "What is the BEST phone number to call the day of the visit?" - include this in appointment notes  2. Do you have or have access to (through a family member/friend) a smartphone with video capability that we can use for your visit?" a. If yes - list this number in appt notes as cell (if different from BEST phone #) and list the appointment type as a VIDEO visit in appointment notes b. If no - list the appointment type as a PHONE visit in appointment notes  3. Confirm consent - "In the setting of the current Covid19 crisis, you are scheduled for a (phone or video) visit with your provider on (date) at (time).  Just as we do with many in-office visits, in order for you to participate in this visit, we must obtain consent.  If you'd like, I can send this to your mychart (if signed up) or email for you to review.  Otherwise, I can obtain your verbal consent now.  All virtual visits are billed to your insurance company just like a normal visit would be.  By agreeing to a virtual visit, we'd like you to understand that the technology does not allow for your provider to perform an examination, and thus may limit your provider's ability to fully assess your condition. If your provider identifies any concerns that need to be evaluated in person, we will make arrangements to do so.  Finally, though the technology is pretty good, we cannot assure that it will always work on either your or our end, and in the setting of a video visit, we may have to convert it to a phone-only visit.  In either situation, we cannot ensure that we have a secure connection.  Are you willing to proceed?" STAFF: Did the patient verbally acknowledge consent to telehealth visit? Document  YES/NO here: yes  4. Advise patient to be prepared - "Two hours prior to your appointment, go ahead and check your blood pressure, pulse, oxygen saturation, and your weight (if you have the equipment to check those) and write them all down. When your visit starts, your provider will ask you for this information. If you have an Apple Watch or Kardia device, please plan to have heart rate information ready on the day of your appointment. Please have a pen and paper handy nearby the day of the visit as well."  5. Give patient instructions for MyChart download to smartphone OR Doximity/Doxy.me as below if video visit (depending on what platform provider is using)  6. Inform patient they will receive a phone call 15 minutes prior to their appointment time (may be from unknown caller ID) so they should be prepared to answer    TELEPHONE CALL NOTE  Tiffany Horton has been deemed a candidate for a follow-up tele-health visit to limit community exposure during the Covid-19 pandemic. I spoke with the patient via phone to ensure availability of phone/video source, confirm preferred email & phone number, and discuss instructions and expectations.  I reminded Tiffany Horton to be prepared with any vital sign and/or heart rhythm information that could potentially be obtained via home monitoring, at the time of her visit. I reminded Tiffany Horton to expect a phone call prior to  her visit.  Weston Anna 11/08/2018 10:06 AM   INSTRUCTIONS FOR DOWNLOADING THE MYCHART APP TO SMARTPHONE  - The patient must first make sure to have activated MyChart and know their login information - If Apple, go to [employer] and type in MyChart in the search bar and download the app. If Android, ask patient to go to [employer] and type in [place] in the search bar and download the app. The app is free but as with any other app downloads, their phone may require them to verify saved payment information or Apple/Android password.    - The patient will need to then log into the app with their MyChart username and password, and select [facility] as their healthcare provider to link the account. When it is time for your visit, go to the MyChart app, find appointments, and click Begin Video Visit. Be sure to Select Allow for your device to access the Microphone and Camera for your visit. You will then be connected, and your provider will be with you shortly.  **If they have any issues connecting, or need assistance please contact MyChart service desk [phone])**  **If using a computer, in order to ensure the best quality for their visit they will need to use either of the following Internet Browsers: [employer], or Google Chrome**  IF USING DOXIMITY or DOXY.ME - The patient will receive a link just prior to their visit by text.     FULL LENGTH CONSENT FOR TELE-HEALTH VISIT   I hereby voluntarily request, consent and authorize [facility] and its employed or contracted physicians, physician assistants, nurse practitioners or other licensed health care professionals (the Practitioner), to provide me with telemedicine health care services (the Services") as deemed necessary by the treating Practitioner. I acknowledge and consent to receive the Services by the Practitioner via telemedicine. I understand that the telemedicine visit will involve communicating with the Practitioner through live audiovisual communication technology and the disclosure of certain medical information by electronic transmission. I acknowledge that I have been given the opportunity to request an in-person assessment or other available alternative prior to the telemedicine visit and am voluntarily participating in the telemedicine visit.  I understand that I have the right to withhold or withdraw my consent to the use of telemedicine in the course of my care at any time, without affecting my right to future care or treatment, and that  the Practitioner or I may terminate the telemedicine visit at any time. I understand that I have the right to inspect all information obtained and/or recorded in the course of the telemedicine visit and may receive copies of available information for a reasonable fee.  I understand that some of the potential risks of receiving the Services via telemedicine include:   Delay or interruption in medical evaluation due to technological equipment failure or disruption;  Information transmitted may not be sufficient (e.g. poor resolution of images) to allow for appropriate medical decision making by the Practitioner; and/or   In rare instances, security protocols could fail, causing a breach of personal health information.  Furthermore, I acknowledge that it is my responsibility to provide information about my medical history, conditions and care that is complete and accurate to the best of my ability. I acknowledge that Practitioner's advice, recommendations, and/or decision may be based on factors not within their control, such as incomplete or inaccurate data provided by me or distortions of diagnostic images or specimens that may result from electronic transmissions.  I understand that the practice of medicine is not an exact science and that Practitioner makes no warranties or guarantees regarding treatment outcomes. I acknowledge that I will receive a copy of this consent concurrently upon execution via email to the email address I last provided but may also request a printed copy by calling the office of [facility].    I understand that my insurance will be billed for this visit.   I have read or had this consent read to me.  I understand the contents of this consent, which adequately explains the benefits and risks of the Services being provided via telemedicine.   I have been provided ample opportunity to ask questions regarding this consent and the Services and have had my questions answered to  my satisfaction.  I give my informed consent for the services to be provided through the use of telemedicine in my medical care  By participating in this telemedicine visit I agree to the above.

## 2018-11-24 NOTE — Progress Notes (Signed)
Virtual Visit via Telephone Note   This visit type was conducted due to national recommendations for restrictions regarding the COVID-19 Pandemic (e.g. social distancing) in an effort to limit this patient's exposure and mitigate transmission in our community.  Due to her co-morbid illnesses, this patient is at least at moderate risk for complications without adequate follow up.  This format is felt to be most appropriate for this patient at this time.  The patient did not have access to video technology/had technical difficulties with video requiring transitioning to audio format only (telephone).  All issues noted in this document were discussed and addressed.  No physical exam could be performed with this format.  Please refer to the patient's chart for her  consent to telehealth for [facility].   Date:  11/25/2018   ID:  Tiffany Horton, DOB [birth year], MRN [id number]  Patient Location: Home Provider Location: Office  PCP:  Marylee Floras, FNP  Cardiologist:  Rozann Lesches, MD Electrophysiologist:  None   Evaluation Performed:  Follow-Up Visit  Chief Complaint:   Cardiac follow-up  History of Present Illness:    Tiffany Horton is a 63 y.o. female last seen in September 2019.  Spoke by phone today.  She continues to work full-time, does not report any sense of palpitations, no shortness of breath with typical activities.  She did have symptomatic nephrolithiasis back in the summer and underwent lithotripsy.  This has resolved, she does state that she had a spell where she thought she may be having recurring stones and actually had what sounds like a vasovagal episode based on description.  This has not recurred however.  She had follow-up at [facility] back in December 2019, continues on [facility].  She reports having follow-up lab work with PCP which we will request.  She goes back to [facility] for follow-up in November.  Carotid Dopplers from September 2019 demonstrated no significant ICA  atherosclerosis.  The patient does not have symptoms concerning for COVID-19 infection (fever, chills, cough, or new shortness of breath).    Past Medical History:  Diagnosis Date  . Allergy   . Carotid atherosclerosis, bilateral   . Diverticulitis   . Family history of adverse reaction to anesthesia    father- could not wake up after open heart surgery  . GERD (gastroesophageal reflux disease)   . History of kidney stones   . History of TIAs    4 possible TIAs in 11/11.  Worked up by [facility] neurology.  Apparently had echo with small PFO. Venous ultrasound with no lower extremity DVT.  Holter (11/11): HR range 47-127, average 74, rare PACs and PVCs, no atrial fibrillation.      . Hypercholesteremia   . Hypertriglyceridemia   . Knee osteoarthritis    Right  . Nephrolithiasis 2003  . PFO (patent foramen ovale)    Past Surgical History:  Procedure Laterality Date  . ABDOMINAL HYSTERECTOMY    . CESAREAN SECTION    . CHOLECYSTECTOMY    . EXTRACORPOREAL SHOCK WAVE LITHOTRIPSY Left 09/02/2018   Procedure: EXTRACORPOREAL SHOCK WAVE LITHOTRIPSY (ESWL);  Surgeon: Alexis Frock, MD;  Location: [facility];  Service: Urology;  Laterality: Left;  . SHOULDER ARTHROSCOPY  12/09/10   LEFT  . TONSILLECTOMY    . WISDOM TOOTH EXTRACTION       Current Meds  Medication Sig  . acetaminophen (TYLENOL) 500 MG tablet Take 1,000 mg by mouth every 6 (six) hours as needed.  . cholecalciferol (VITAMIN D) 1000 UNITS  tablet Take 1,000 Units by mouth daily.  . clopidogrel (PLAVIX) 75 MG tablet Take 75 mg by mouth daily.  . fish oil-omega-3 fatty acids 1000 MG capsule Take 2 g by mouth daily.   . fluticasone (FLONASE) 50 MCG/ACT nasal spray Place 2 sprays into both nostrils daily as needed.   . Garlic XX123456 MG CAPS Take 1 capsule by mouth daily.  . Multiple Vitamin (MULTIVITAMIN) tablet Take 1 tablet by mouth daily.  . pantoprazole (PROTONIX) 40 MG tablet Take 40 mg by mouth daily.   Marland Kitchen PRALUENT 75 MG/ML SOPN  Inject 1 mL into the skin every 14 (fourteen) days.   . Probiotic Product (PROBIOTIC ADVANCED) CAPS Take by mouth every other day.      Allergies:   Bactrim [sulfamethoxazole-trimethoprim], Codeine, Ivp dye [iodinated diagnostic agents], Macrobid [nitrofurantoin macrocrystal], Morphine, Penicillins, Adhesive [tape], Levaquin [levofloxacin in d5w], Lipitor [atorvastatin calcium], and Zocor [simvastatin - high dose]   Social History   Tobacco Use  . Smoking status: Never Smoker  . Smokeless tobacco: Never Used  Substance Use Topics  . Alcohol use: No    Comment: occ  . Drug use: No     Family Hx: The patient's family history includes COPD in her mother; Colon cancer in her maternal grandmother; Coronary artery disease in her father; Deep vein thrombosis in her father; Heart attack in her father; Hypertension in her father. There is no history of Esophageal cancer, Pancreatic cancer, Prostate cancer, Rectal cancer, or Stomach cancer.  ROS:   Please see the history of present illness.    Kidney stone this past summer - had lithotripsy. All other systems reviewed and are negative.   Prior CV studies:   The following studies were reviewed today:  Echocardiogram 10/14/2015: Study Conclusions  - Left ventricle: The cavity size was normal. Wall thickness was normal. Systolic function was vigorous. The estimated ejection fraction was in the range of 65% to 70%. Doppler parameters are  consistent with abnormal left ventricular relaxation (grade 1 diastolic dysfunction).  Carotid Dopplers 10/25/2017: Final Interpretation: Right Carotid: There was no evidence of thrombus, dissection, atherosclerotic                plaque or stenosis in the cervical carotid system.  Left Carotid: There was no evidence of thrombus, dissection, atherosclerotic               plaque or stenosis in the cervical carotid system.  Vertebrals:  Bilateral vertebral arteries demonstrate antegrade flow.  Subclavians: Normal flow hemodynamics were seen in bilateral subclavian              arteries.  Labs/Other Tests and Data Reviewed:    EKG:  An ECG dated 10/10/2017 was personally reviewed today and demonstrated:  Sinus rhythm with decreased R wave progression.  Recent Labs: 08/26/2018: ALT 26; BUN 26; Creatinine, Ser 0.93; Hemoglobin 14.6; Platelets 291; Potassium 3.8; Sodium 143    Wt Readings from Last 3 Encounters:  11/25/18 151 lb (68.5 kg)  09/02/18 154 lb 9.6 oz (70.1 kg)  08/26/18 155 lb (70.3 kg)     Objective:    Vital Signs:  BP 112/70   Pulse 78   Ht 5\' 2"  (1.575 m)   Wt 151 lb (68.5 kg)   BMI 27.62 kg/m    Patient spoke in full sentences, not short of breath. No audible wheezing or coughing. Speech pattern normal.  ASSESSMENT & PLAN:    1.  Familial hyperlipidemia with statin intolerance.  She  continues to follow at [facility] and remains on Praluent.  Requesting recent lipids from PCP.  2.  History of small PFO and previous TIAs.  She has had no obvious recurrent neurological events.  She continues to follow with neurology at [facility] and is on Plavix.  Her last echocardiogram was in 2017.  3.  Follow-up carotid Dopplers from last year showed no significant atherosclerosis.  COVID-19 Education: The signs and symptoms of COVID-19 were discussed with the patient and how to seek care for testing (follow up with PCP or arrange E-visit).  The importance of social distancing was discussed today.  Time:   Today, I have spent 6 minutes with the patient with telehealth technology discussing the above problems.     Medication Adjustments/Labs and Tests Ordered: Current medicines are reviewed at length with the patient today.  Concerns regarding medicines are outlined above.   Tests Ordered: No orders of the defined types were placed in this encounter.   Medication Changes: No orders of the defined types were placed in this encounter.   Follow Up:  In Person 1 year in  the [place] office with ECG.  Signed, Rozann Lesches, MD  11/25/2018 9:57 AM    [facility]

## 2018-11-25 ENCOUNTER — Telehealth (INDEPENDENT_AMBULATORY_CARE_PROVIDER_SITE_OTHER): Payer: PRIVATE HEALTH INSURANCE | Admitting: Cardiology

## 2018-11-25 ENCOUNTER — Encounter: Payer: Self-pay | Admitting: Cardiology

## 2018-11-25 ENCOUNTER — Encounter: Payer: Self-pay | Admitting: *Deleted

## 2018-11-25 VITALS — BP 112/70 | HR 78 | Ht 62.0 in | Wt 151.0 lb

## 2018-11-25 DIAGNOSIS — Q2112 Patent foramen ovale: Secondary | ICD-10-CM

## 2018-11-25 DIAGNOSIS — E7849 Other hyperlipidemia: Secondary | ICD-10-CM

## 2018-11-25 DIAGNOSIS — Q211 Atrial septal defect: Secondary | ICD-10-CM

## 2018-11-25 DIAGNOSIS — Z8673 Personal history of transient ischemic attack (TIA), and cerebral infarction without residual deficits: Secondary | ICD-10-CM | POA: Diagnosis not present

## 2018-11-25 NOTE — Patient Instructions (Signed)

## 2019-04-14 ENCOUNTER — Ambulatory Visit (INDEPENDENT_AMBULATORY_CARE_PROVIDER_SITE_OTHER): Payer: PRIVATE HEALTH INSURANCE | Admitting: Cardiology

## 2019-04-14 ENCOUNTER — Telehealth: Payer: Self-pay | Admitting: Cardiology

## 2019-04-14 ENCOUNTER — Encounter: Payer: Self-pay | Admitting: Cardiology

## 2019-04-14 ENCOUNTER — Other Ambulatory Visit: Payer: Self-pay

## 2019-04-14 VITALS — BP 122/90 | HR 69 | Ht 62.0 in | Wt 158.0 lb

## 2019-04-14 DIAGNOSIS — R002 Palpitations: Secondary | ICD-10-CM

## 2019-04-14 DIAGNOSIS — Z8673 Personal history of transient ischemic attack (TIA), and cerebral infarction without residual deficits: Secondary | ICD-10-CM

## 2019-04-14 NOTE — Progress Notes (Signed)
Cardiology Office Note  Date: 04/14/2019   ID: Tiffany Horton, DOB [birth year], MRN [id number]  PCP:  Tiffany Floras, FNP  Cardiologist:  Rozann Lesches, MD Electrophysiologist:  None   Chief Complaint  Patient presents with  . Palpitations    History of Present Illness: Tiffany Horton is a 64 y.o. female last assessed via telehealth encounter October 2020.  She is referred back to the office by Ms. Lovena Le NP for evaluation of palpitations.  She states that she got the second dose of coronavirus vaccine last week.  She had achiness, feeling of soreness that developed thereafter, also intermittent feelings of rapid heartbeat and shortness of breath with activity.  This actually lasted for a few days, bothered her when she went for a walk on a local trail, also felt fatigued.  By the time she saw her PCP on Friday her symptoms were getting better.  At that time her ECG was noncontributory.  She states that today she feels back to baseline.  At no point did she describe any syncope, no recurring exertional chest pain.  I personally reviewed her ECG today which shows sinus rhythm with left atrial enlargement.  She continues to follow with [facility] neurology I reviewed the note from December 2020.  She also sees Dr. Arther Dames, last LDL 48.  No recent changes in her current medications, listed below.  Past Medical History:  Diagnosis Date  . Allergy   . Carotid atherosclerosis, bilateral   . Diverticulitis   . GERD (gastroesophageal reflux disease)   . History of kidney stones   . History of TIAs    4 possible TIAs in 11/11.  Worked up by [facility] neurology.  Apparently had echo with small PFO. Venous ultrasound with no lower extremity DVT.  Holter (11/11): HR range 47-127, average 74, rare PACs and PVCs, no atrial fibrillation.      . Hypertriglyceridemia   . Knee osteoarthritis    Right  . Mixed hyperlipidemia   . Nephrolithiasis 2003  . PFO (patent foramen ovale)     Past Surgical History:    Procedure Laterality Date  . ABDOMINAL HYSTERECTOMY    . CESAREAN SECTION    . CHOLECYSTECTOMY    . EXTRACORPOREAL SHOCK WAVE LITHOTRIPSY Left 09/02/2018   Procedure: EXTRACORPOREAL SHOCK WAVE LITHOTRIPSY (ESWL);  Surgeon: Alexis Frock, MD;  Location: [facility];  Service: Urology;  Laterality: Left;  . SHOULDER ARTHROSCOPY  12/09/10   LEFT  . TONSILLECTOMY    . WISDOM TOOTH EXTRACTION      Current Outpatient Medications  Medication Sig Dispense Refill  . acetaminophen (TYLENOL) 500 MG tablet Take 1,000 mg by mouth every 6 (six) hours as needed.    . cholecalciferol (VITAMIN D) 1000 UNITS tablet Take 1,000 Units by mouth daily.    . clopidogrel (PLAVIX) 75 MG tablet Take 75 mg by mouth daily.    . fish oil-omega-3 fatty acids 1000 MG capsule Take 2 g by mouth daily.     . fluticasone (FLONASE) 50 MCG/ACT nasal spray Place 2 sprays into both nostrils daily as needed.     . Garlic XX123456 MG CAPS Take 1 capsule by mouth daily.    . Multiple Vitamin (MULTIVITAMIN) tablet Take 1 tablet by mouth daily.    . pantoprazole (PROTONIX) 40 MG tablet Take 40 mg by mouth daily.     Marland Kitchen PRALUENT 75 MG/ML SOPN Inject 1 mL into the skin every 14 (fourteen) days.     . Probiotic  Product (PROBIOTIC ADVANCED) CAPS Take by mouth every other day.      No current facility-administered medications for this visit.   Allergies:  Bactrim [sulfamethoxazole-trimethoprim], Codeine, Ivp dye [iodinated diagnostic agents], Macrobid [nitrofurantoin macrocrystal], Morphine, Penicillins, Adhesive [tape], Levaquin [levofloxacin in d5w], Lipitor [atorvastatin calcium], and Zocor [simvastatin - high dose]   ROS:   No fevers or chills.  No rashes.  Physical Exam: VS:  BP 122/90   Pulse 69   Ht 5\' 2"  (1.575 m)   Wt 158 lb (71.7 kg)   SpO2 98%   BMI 28.90 kg/m , BMI Body mass index is 28.9 kg/m.  Wt Readings from Last 3 Encounters:  04/14/19 158 lb (71.7 kg)  11/25/18 151 lb (68.5 kg)  09/02/18 154 lb 9.6 oz (70.1 kg)     General: Patient appears comfortable at rest. HEENT: Conjunctiva and lids normal, wearing a mask. Neck: Supple, no elevated JVP or carotid bruits, no thyromegaly. Lungs: Clear to auscultation, nonlabored breathing at rest. Cardiac: Regular rate and rhythm, no S3 or significant systolic murmur. Abdomen: Soft, nontender, bowel sounds present. Extremities: No pitting edema, distal pulses 2+.  ECG:  An ECG dated 10/10/2017 was personally reviewed today and demonstrated:  Sinus rhythm with decreased R wave progression.  Recent Labwork: 08/26/2018: ALT 26; AST 27; BUN 26; Creatinine, Ser 0.93; Hemoglobin 14.6; Platelets 291; Potassium 3.8; Sodium 143   Other Studies Reviewed Today:  Carotid Dopplers 10/25/2017: Final Interpretation:  Right Carotid: There was no evidence of thrombus, dissection,  atherosclerotic         plaque or stenosis in the cervical carotid system.   Left Carotid: There was no evidence of thrombus, dissection,  atherosclerotic        plaque or stenosis in the cervical carotid system.   Vertebrals: Bilateral vertebral arteries demonstrate antegrade flow.  Subclavians: Normal flow hemodynamics were seen in bilateral subclavian        arteries.   Echocardiogram 10/14/2015: Study Conclusions   - Left ventricle: The cavity size was normal. Wall thickness was  normal. Systolic function was vigorous. The estimated ejection  fraction was in the range of 65% to 70%. Doppler parameters are  consistent with abnormal left ventricular relaxation (grade 1  diastolic dysfunction).   Assessment and Plan:  1.  Recent palpitations and shortness of breath, this followed receiving her second coronavirus vaccine dose.  She also had somewhat less intense symptoms after the first dose of vaccine, and reports having COVID-19 in January.  I reviewed her recent ECG obtained by PCP, also today's tracing.  Although symptoms have resolved at this time, we  will obtain a 7-day Zio patch to ensure no intermittent change in rhythm, mainly to exclude atrial fibrillation which would lead to a change in her current medications.  2.  Previous history of TIAs, she follows with neurology at [facility] and continues on Plavix.  She has history of mild carotid atherosclerosis, no significant progression by her last carotid Dopplers in 2019.  She is also on Praluent for treatment of familial hyperlipidemia and statin intolerance, last LDL well controlled at 48.  Medication Adjustments/Labs and Tests Ordered: Current medicines are reviewed at length with the patient today.  Concerns regarding medicines are outlined above.   Tests Ordered: Orders Placed This Encounter  Procedures  . LONG TERM MONITOR (3-14 DAYS)  . EKG 12-Lead    Medication Changes: No orders of the defined types were placed in this encounter.   Disposition:  Follow up test results  Signed, Satira Sark, MD, [facility] 04/14/2019 3:49 PM    [facility] [place], [place], [place] [address] Phone: [phone]; Fax: [phone]

## 2019-04-14 NOTE — Patient Instructions (Addendum)
Medication Instructions:   Your physician recommends that you continue on your current medications as directed. Please refer to the Current Medication list given to you today.  Labwork:  NONE  Testing/Procedures: Your physician has recommended that you wear an event monitor for 7 days. Event monitors are medical devices that record the heart's electrical activity. Doctors most often [place] these monitors to diagnose arrhythmias. Arrhythmias are problems with the speed or rhythm of the heartbeat. The monitor is a small, portable device. You can wear one while you do your normal daily activities. This is usually used to diagnose what is causing palpitations/syncope (passing out). [employer] will contact you.  Follow-Up:  Your physician recommends that you schedule a follow-up appointment in: 1 year (office). You will receive a reminder letter in the mail in about 10 months reminding you to call and schedule your appointment. If you don't receive this letter, please contact our office.  Any Other Special Instructions Will Be Listed Below (If Applicable).  If you need a refill on your cardiac medications before your next appointment, please call your pharmacy.

## 2019-04-14 NOTE — Telephone Encounter (Signed)
Precert : 7 Day ZIO dx: palpitations & SOB

## 2019-04-15 ENCOUNTER — Telehealth: Payer: Self-pay | Admitting: Cardiology

## 2019-04-15 NOTE — Telephone Encounter (Signed)
Pt says she has broke out in hives and itching around zio monitor - pt given irhythm phone number to call to see if they have any sensitive skin patches or any other options.

## 2019-04-15 NOTE — Telephone Encounter (Signed)
Patient placed monitor on this morning. Her skin is having a reaction each place it is attached to her. Hives and itching really bad

## 2019-04-15 NOTE — Telephone Encounter (Signed)
Sharyn Lull with [employer] called stating that patient is returning her monitor. States that it is causing hives.    [phone] reference # [id number]

## 2019-09-18 IMAGING — CT CT RENAL STONE PROTOCOL
2 of 4 series · 17 of 46 positions shown, 19 images · non-contrast
Comparison: 07/19/16

CLINICAL DATA: Flank pain

EXAM:
CT ABDOMEN AND PELVIS WITHOUT CONTRAST
TECHNIQUE: Multidetector CT imaging of the abdomen and pelvis was performed
following the standard protocol without IV contrast.

[Series 2: axial st · axial · 0.68mm/px · z∈[+1091,+1466]mm · 14 of 85 slices shown, 16 images]
[im 5/85  soft-tissue]
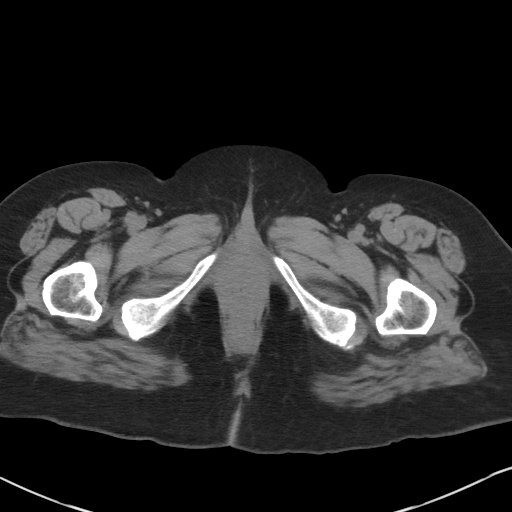
[im 5/85  bone]
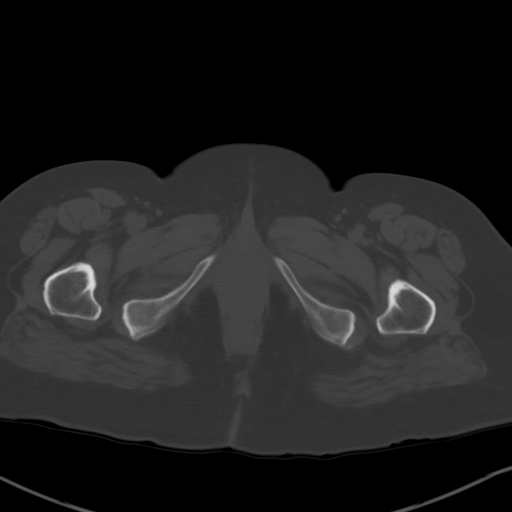
[im 9/85  soft-tissue]
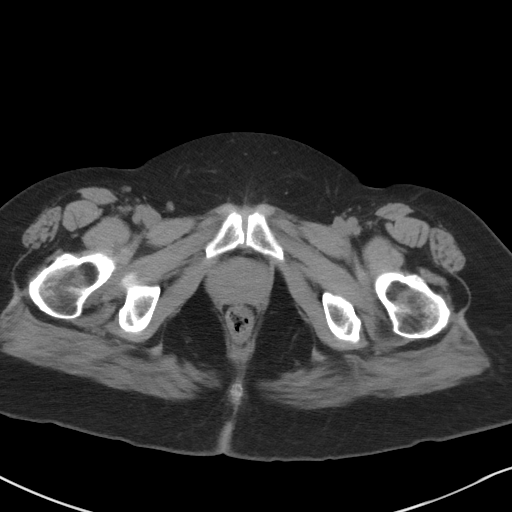
[im 18/85  soft-tissue]
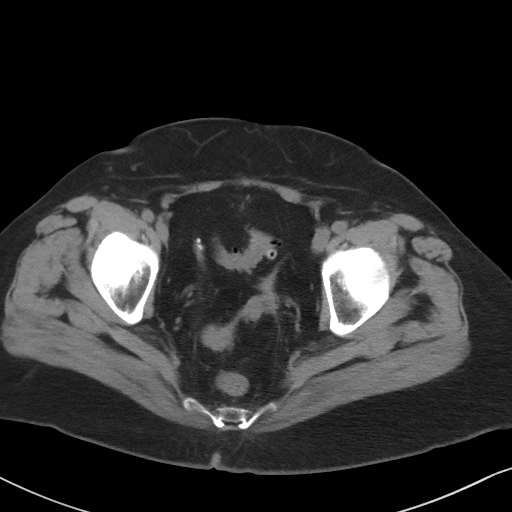
[im 23/85  soft-tissue]
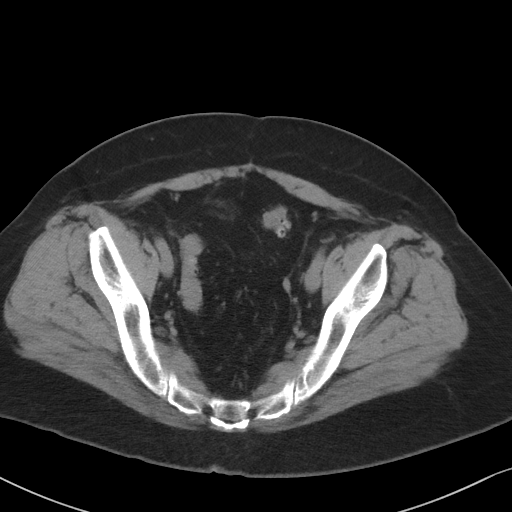
[im 27/85  soft-tissue]
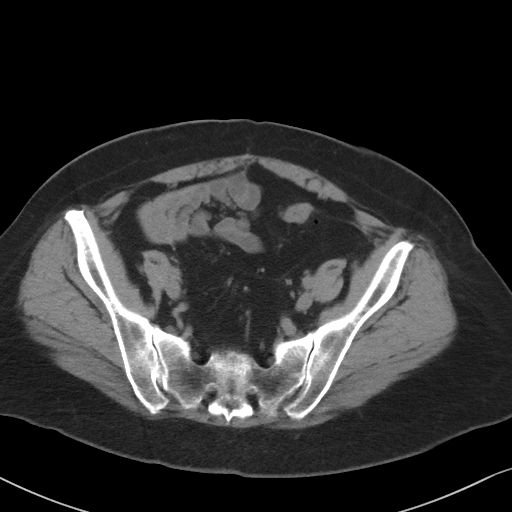
[im 36/85  soft-tissue]
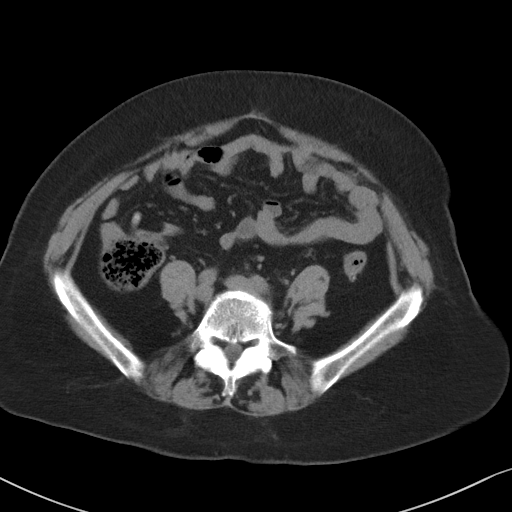
[im 40/85  soft-tissue]
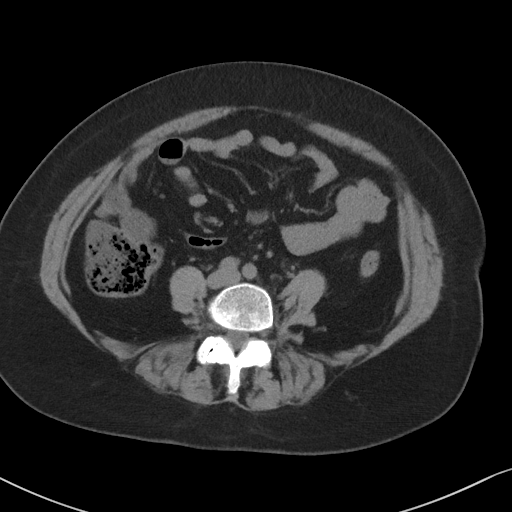
[im 45/85  soft-tissue]
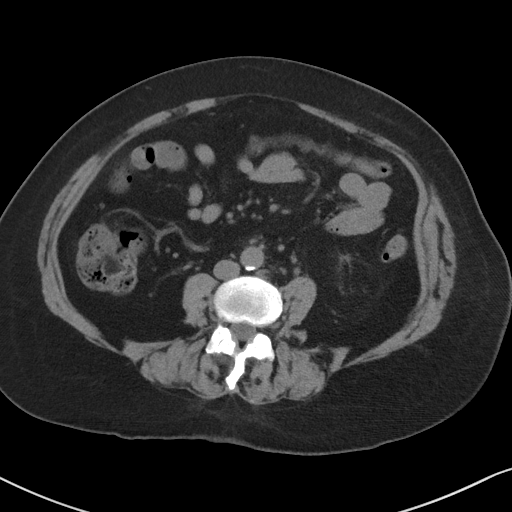
[im 49/85  soft-tissue]
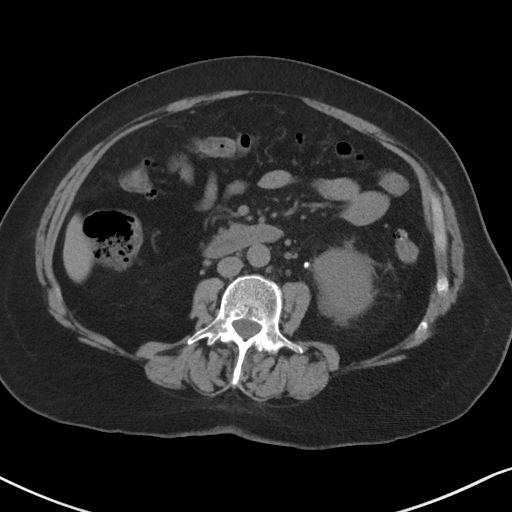
[im 49/85  bone]
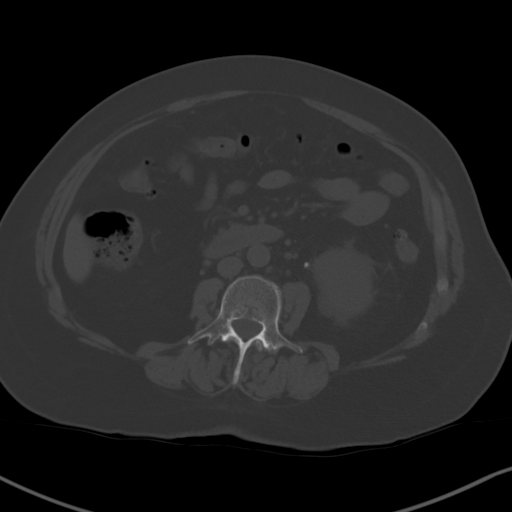
[im 58/85  soft-tissue]
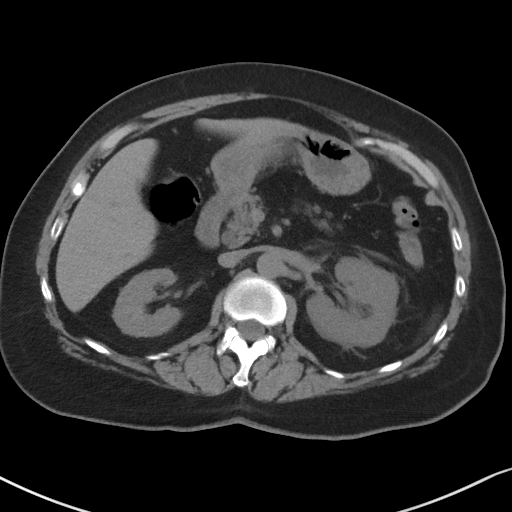
[im 62/85  soft-tissue]
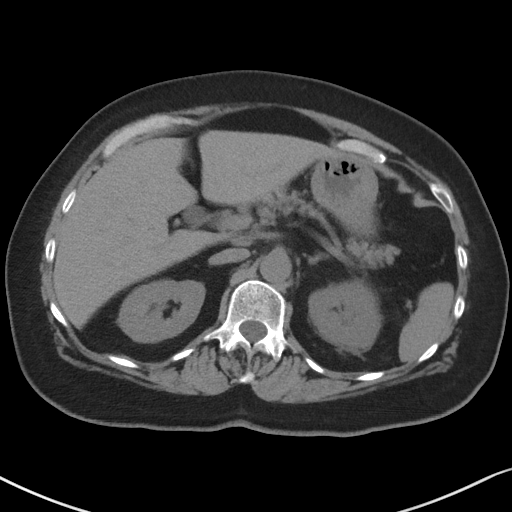
[im 67/85  soft-tissue]
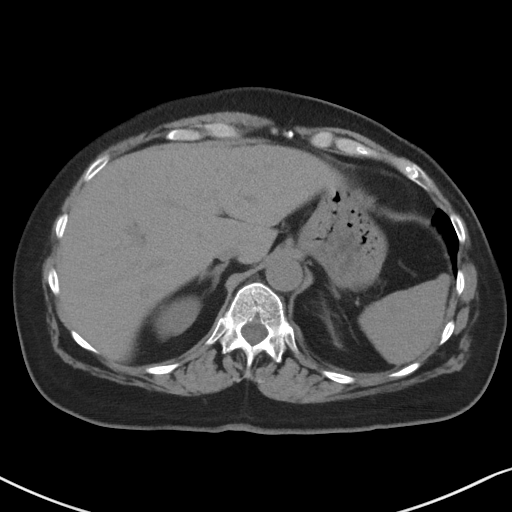
[im 76/85  soft-tissue]
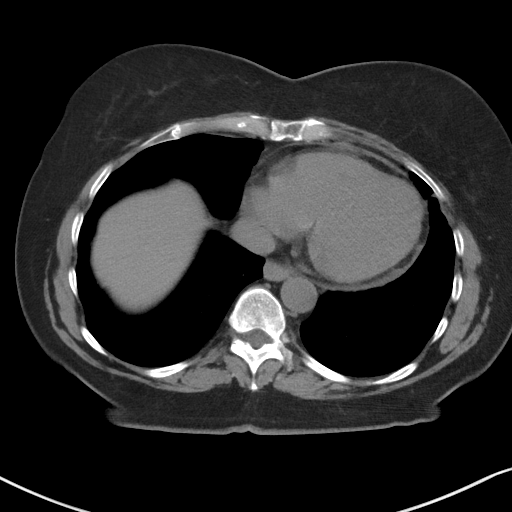
[im 80/85  soft-tissue]
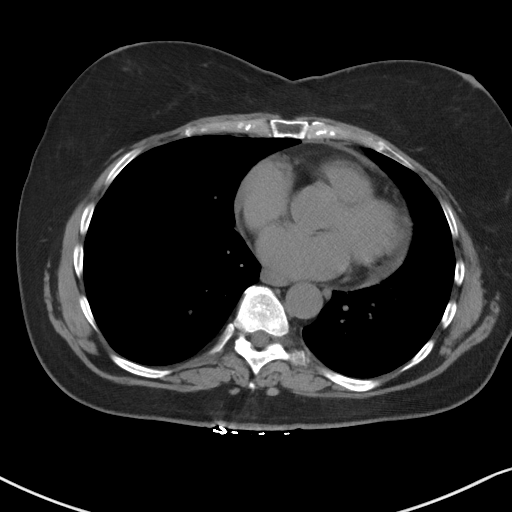

[Series 5: coronal st · coronal · 0.71mm/px · 3 of 89 slices shown]
[im 30/89  soft-tissue]
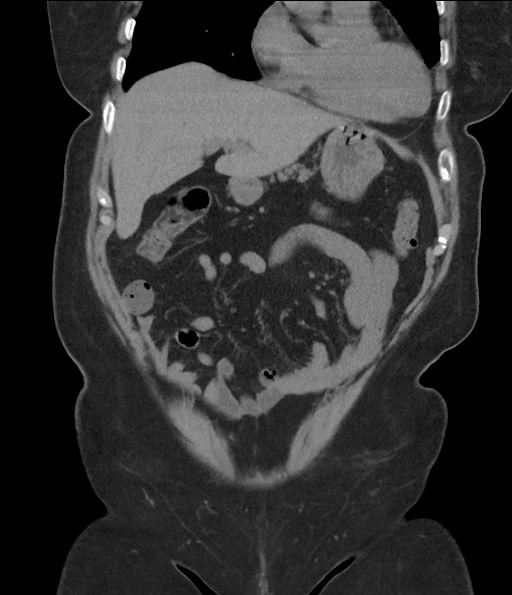
[im 40/89  soft-tissue]
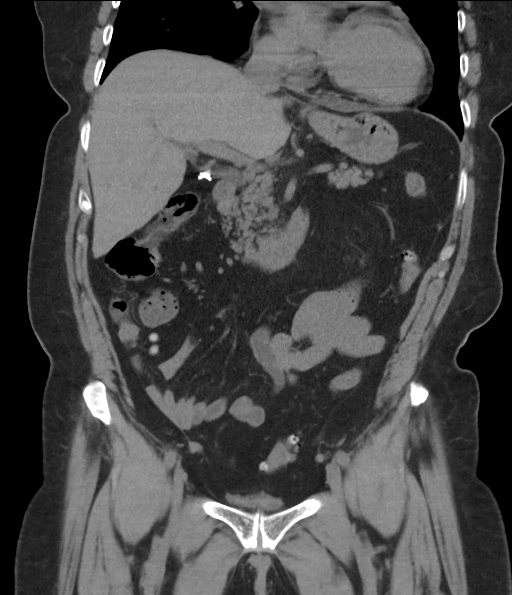
[im 49/89  soft-tissue]
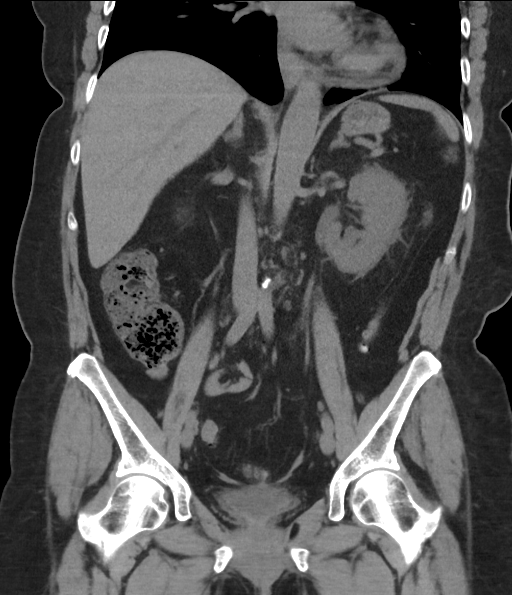

[17 of 46 positions shown; findings below may reference images not displayed]

FINDINGS: Lower chest:  Trace pericardial fluid and/or thickening

Hepatobiliary: No focal liver abnormality.Cholecystectomy which
accounts for mild prominence of the common bile duct.

Pancreas: Unremarkable.

Spleen: Unremarkable.

Adrenals/Urinary Tract: Negative adrenals. Left hydronephrosis,
renal expansion, and perinephric edema secondary to a UPJ calculus
measuring 5 x 3 mm. 5 mm right lower pole calculus. Collapsed
urinary bladder.

Stomach/Bowel: No obstruction. No appendicitis. Sigmoid
diverticulosis

Vascular/Lymphatic: No acute vascular abnormality. No mass or
adenopathy.

Reproductive:Hysterectomy.

Other: No ascites or pneumoperitoneum.

Musculoskeletal: No acute abnormalities.
IMPRESSION: 1. Obstructing 5 x 3 mm left UPJ calculus.
2. Right nephrolithiasis and colonic diverticulosis.

## 2019-09-25 IMAGING — CR ABDOMEN - 1 VIEW
1 series · 1 of 1 positions shown · non-contrast
Comparison: None.

CLINICAL DATA: Left ureteral stone, pre lithotripsy.

EXAM:
ABDOMEN - 1 VIEW

[t abdomen supine]
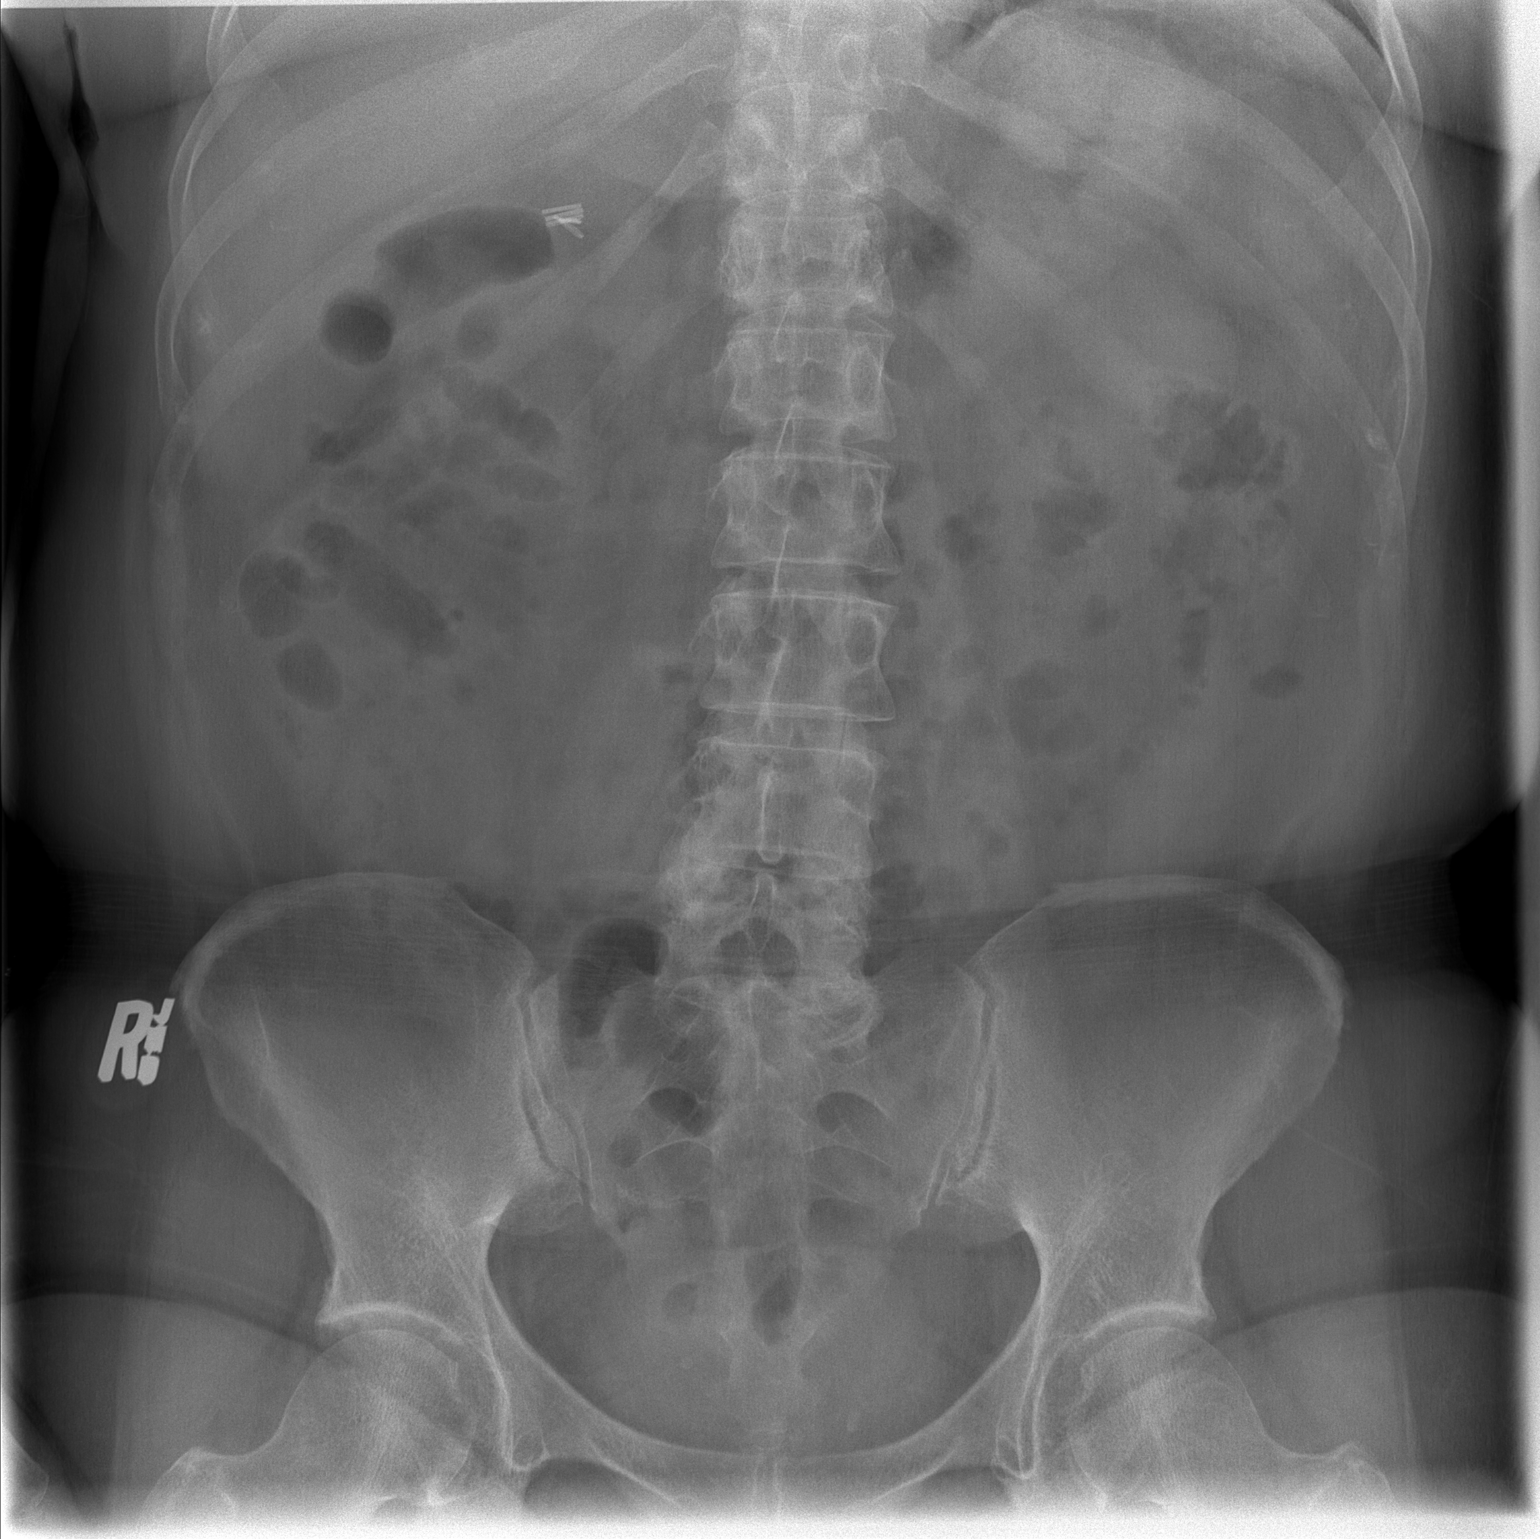

[1 of 1 positions shown; findings below may reference images not displayed]

FINDINGS: A lower pole right renal calculus was better appreciated on recent
prior CT abdomen/pelvis 08/26/2018. A small hyperdensity projecting
over the mid abdomen left of midline may correspond with the 5 mm
left UPJ calculus demonstrated on prior cross-sectional imaging.
Several small calcific foci projecting over the pelvis are
nonspecific, but may reflect phleboliths. No dilated loops of bowel
are demonstrated to suggest obstruction. Cholecystectomy clips. Mild
lumbar levocurvature. Degenerative change of the lower lumbar spine.
IMPRESSION: Small hyperdensity projecting over the mid abdomen left of midline,
which may correspond with the 5 mm left UPJ calculus demonstrated on
CT abdomen/pelvis 08/26/2018.

A right lower pole renal calculus was better appreciated on recent
prior cross-sectional imaging.

## 2020-04-14 ENCOUNTER — Encounter: Payer: Self-pay | Admitting: Cardiology

## 2020-04-14 ENCOUNTER — Ambulatory Visit: Payer: PRIVATE HEALTH INSURANCE | Admitting: Cardiology

## 2020-04-14 VITALS — BP 122/82 | HR 81 | Ht 62.0 in | Wt 164.0 lb

## 2020-04-14 DIAGNOSIS — Z8673 Personal history of transient ischemic attack (TIA), and cerebral infarction without residual deficits: Secondary | ICD-10-CM | POA: Diagnosis not present

## 2020-04-14 DIAGNOSIS — E7849 Other hyperlipidemia: Secondary | ICD-10-CM | POA: Diagnosis not present

## 2020-04-14 DIAGNOSIS — R002 Palpitations: Secondary | ICD-10-CM

## 2020-04-14 NOTE — Patient Instructions (Addendum)

## 2020-04-14 NOTE — Progress Notes (Signed)
Cardiology Office Note  Date: 04/14/2020   ID: Tiffany Horton, DOB [birth year], MRN [id number]  PCP:  Marylee Floras, FNP  Cardiologist:  Tiffany Lesches, MD Electrophysiologist:  None   Chief Complaint  Patient presents with  . Cardiac follow-up    History of Present Illness: Tiffany Horton is a 65 y.o. female last seen in March 2021.  She presents for a routine visit.  She reports improvement in palpitations since last visit.  We did arrange cardiac monitoring for evaluation, although she had skin reaction to the monitor and was not able to wear it.  Follow-up ECG today is normal.  She continues to follow at [facility].  I reviewed her medications which are outlined below.  She had an intermittent switch to Repatha per [facility] although is now back on Praluent having not tolerated the Repatha.  I reviewed her most recent lab work as noted below.  She does not report any bleeding problems on Plavix.  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  Past Medical History:  Diagnosis Date  . Allergy   . Carotid atherosclerosis, bilateral   . Diverticulitis   . GERD (gastroesophageal reflux disease)   . History of kidney stones   . History of TIAs    4 possible TIAs in 11/11.  Worked up by [facility] neurology.  Apparently had echo with small PFO. Venous ultrasound with no lower extremity DVT.  Holter (11/11): HR range 47-127, average 74, rare PACs and PVCs, no atrial fibrillation.      . Hypertriglyceridemia   . Knee osteoarthritis    Right  . Mixed hyperlipidemia   . Nephrolithiasis 2003  . PFO (patent foramen ovale)     Past Surgical History:  Procedure Laterality Date  . ABDOMINAL HYSTERECTOMY    . CESAREAN SECTION    . CHOLECYSTECTOMY    . EXTRACORPOREAL SHOCK WAVE LITHOTRIPSY Left 09/02/2018   Procedure: EXTRACORPOREAL SHOCK WAVE LITHOTRIPSY (ESWL);  Surgeon: Alexis Frock, MD;  Location: [facility];  Service: Urology;  Laterality: Left;  .  SHOULDER ARTHROSCOPY  12/09/10   LEFT  . TONSILLECTOMY    . WISDOM TOOTH EXTRACTION      Current Outpatient Medications  Medication Sig Dispense Refill  . acetaminophen (TYLENOL) 500 MG tablet Take 1,000 mg by mouth every 6 (six) hours as needed.    . cholecalciferol (VITAMIN D) 1000 UNITS tablet Take 1,000 Units by mouth daily.    . clopidogrel (PLAVIX) 75 MG tablet Take 75 mg by mouth daily.    . fish oil-omega-3 fatty acids 1000 MG capsule Take 2 g by mouth daily.     . fluticasone (FLONASE) 50 MCG/ACT nasal spray Place 2 sprays into both nostrils daily as needed.     . Garlic 782 MG CAPS Take 1 capsule by mouth daily.    . Multiple Vitamin (MULTIVITAMIN) tablet Take 1 tablet by mouth daily.    . pantoprazole (PROTONIX) 40 MG tablet Take 40 mg by mouth daily.     Marland Kitchen PRALUENT 75 MG/ML SOPN Inject 1 mL into the skin every 14 (fourteen) days.     . Probiotic Product (PROBIOTIC ADVANCED) CAPS Take by mouth every other day.      No current facility-administered medications for this visit.   Allergies:  Bactrim [sulfamethoxazole-trimethoprim], Codeine, Ivp dye [iodinated diagnostic agents], Macrobid [nitrofurantoin macrocrystal], Morphine, Penicillins, Adhesive [tape], Levaquin [levofloxacin in d5w], Lipitor [atorvastatin calcium], and Zocor [simvastatin - high dose]  ROS: No syncope.  Physical Exam: VS:  BP 122/82   Pulse 81   Ht 5\' 2"  (1.575 m)   Wt 164 lb (74.4 kg)   SpO2 92%   BMI 30.00 kg/m , BMI Body mass index is 30 kg/m.  Wt Readings from Last 3 Encounters:  04/14/20 164 lb (74.4 kg)  04/14/19 158 lb (71.7 kg)  11/25/18 151 lb (68.5 kg)    General: Patient appears comfortable at rest. HEENT: Conjunctiva and lids normal, wearing a mask. Neck: Supple, no elevated JVP or carotid bruits, no thyromegaly. Lungs: Clear to auscultation, nonlabored breathing at rest. Cardiac: Regular rate and rhythm, no S3 or significant systolic murmur, no pericardial rub. Extremities: No  pitting edema.  ECG:  An ECG dated 04/14/2019 was personally reviewed today and demonstrated:  Sinus rhythm with possible left atrial enlargement.  Recent Labwork:  October 2021: Cholesterol 156, triglycerides 145, HDL 61, LDL 72  Other Studies Reviewed Today:  Carotid Dopplers 10/25/2017: Final Interpretation:  Right Carotid: There was no evidence of thrombus, dissection,  atherosclerotic         plaque or stenosis in the cervical carotid system.   Left Carotid: There was no evidence of thrombus, dissection,  atherosclerotic        plaque or stenosis in the cervical carotid system.   Vertebrals: Bilateral vertebral arteries demonstrate antegrade flow.  Subclavians: Normal flow hemodynamics were seen in bilateral subclavian        arteries.   Echocardiogram 10/14/2015: Study Conclusions   - Left ventricle: The cavity size was normal. Wall thickness was  normal. Systolic function was vigorous. The estimated ejection  fraction was in the range of 65% to 70%. Doppler parameters are  consistent with abnormal left ventricular relaxation (grade 1  diastolic dysfunction).   Assessment and Plan:  1.  Familial hyperlipidemia with statin intolerance.  She is on Praluent and continues to follow at [facility], last LDL was 72.  2.  History of TIAs, no obvious recurrence on Plavix.  3.  Palpitations, improved since discussion last year.  No associated syncope.  She was not able to tolerate cardiac monitoring due to skin reaction.  ECG is normal today.  Continue with observation for now.  Medication Adjustments/Labs and Tests Ordered: Current medicines are reviewed at length with the patient today.  Concerns regarding medicines are outlined above.   Tests Ordered: Orders Placed This Encounter  Procedures  . EKG 12-Lead    Medication Changes: No orders of the defined types were placed in this encounter.   Disposition:  Follow up 1 year in the [place]  office.  Signed, Satira Sark, MD, [facility] 04/14/2020 2:41 PM    [facility] [address], [place], [place] [address] Phone: [phone]; Fax: [phone]

## 2021-03-22 ENCOUNTER — Encounter: Payer: Self-pay | Admitting: Nurse Practitioner

## 2021-03-22 ENCOUNTER — Ambulatory Visit (INDEPENDENT_AMBULATORY_CARE_PROVIDER_SITE_OTHER): Payer: PRIVATE HEALTH INSURANCE | Admitting: Nurse Practitioner

## 2021-03-22 ENCOUNTER — Other Ambulatory Visit (INDEPENDENT_AMBULATORY_CARE_PROVIDER_SITE_OTHER): Payer: PRIVATE HEALTH INSURANCE

## 2021-03-22 VITALS — BP 132/84 | HR 72 | Ht 61.25 in | Wt 156.1 lb

## 2021-03-22 DIAGNOSIS — R109 Unspecified abdominal pain: Secondary | ICD-10-CM

## 2021-03-22 DIAGNOSIS — K5900 Constipation, unspecified: Secondary | ICD-10-CM

## 2021-03-22 DIAGNOSIS — R197 Diarrhea, unspecified: Secondary | ICD-10-CM | POA: Diagnosis not present

## 2021-03-22 LAB — COMPREHENSIVE METABOLIC PANEL
ALT: 18 U/L (ref 0–35)
AST: 19 U/L (ref 0–37)
Albumin: 4.9 g/dL (ref 3.5–5.2)
Alkaline Phosphatase: 56 U/L (ref 39–117)
BUN: 17 mg/dL (ref 6–23)
CO2: 32 mEq/L (ref 19–32)
Calcium: 10.2 mg/dL (ref 8.4–10.5)
Chloride: 105 mEq/L (ref 96–112)
Creatinine, Ser: 0.69 mg/dL (ref 0.40–1.20)
GFR: 90.62 mL/min (ref >60.00)
Glucose, Bld: 90 mg/dL (ref 70–99)
Potassium: 4.1 mEq/L (ref 3.5–5.1)
Sodium: 141 mEq/L (ref 135–145)
Total Bilirubin: 0.8 mg/dL (ref 0.2–1.2)
Total Protein: 7.5 g/dL (ref 6.0–8.3)

## 2021-03-22 LAB — CBC WITH DIFFERENTIAL/PLATELET
Basophils Absolute: 0 10*3/uL (ref 0.0–0.1)
Basophils Relative: 0.6 % (ref 0.0–3.0)
Eosinophils Absolute: 0.2 10*3/uL (ref 0.0–0.7)
Eosinophils Relative: 2.2 % (ref 0.0–5.0)
HCT: 41.4 % (ref 36.0–46.0)
Hemoglobin: 14.1 g/dL (ref 12.0–15.0)
Lymphocytes Relative: 46.1 % — ABNORMAL HIGH (ref 12.0–46.0)
Lymphs Abs: 3.5 10*3/uL (ref 0.7–4.0)
MCHC: 34.2 g/dL (ref 30.0–36.0)
MCV: 91.7 fl (ref 78.0–100.0)
Monocytes Absolute: 0.6 10*3/uL (ref 0.1–1.0)
Monocytes Relative: 7.8 % (ref 3.0–12.0)
Neutro Abs: 3.3 10*3/uL (ref 1.4–7.7)
Neutrophils Relative %: 43.3 % (ref 43.0–77.0)
Platelets: 308 10*3/uL (ref 150.0–400.0)
RBC: 4.51 Mil/uL (ref 3.87–5.11)
RDW: 13.4 % (ref 11.5–15.5)
WBC: 7.5 10*3/uL (ref 4.0–10.5)

## 2021-03-22 NOTE — Patient Instructions (Addendum)
LABS:   Please proceed to the basement level for lab work before leaving today. Press "B" on the elevator. The lab is located at the first door on the left as you exit the elevator.  HEALTHCARE LAWS AND MY CHART RESULTS:   Due to recent changes in healthcare laws, you may see results of your imaging and/or laboratory studies on MyChart before I have had a chance to review them.  I understand that in some cases there may be results that are confusing or concerning to you. Please understand that not all results are received at the same time and often I may need to interpret multiple results in order to provide you with the best plan of care or course of treatment. Therefore, I ask that you please give me 48 hours to thoroughly review all your results before contacting my office for clarification.   RECOMMENDATIONS:  Benefiber- 1 tablespoon daily. IBgard 1 capsule twice a day as needed for abdominal pain. This is over the counter. Follow up with Dr. Henrene Pastor in 2 months We will work on getting a copy of the CT scan you had done.   BMI:  If you are age 8 or older, your body mass index should be between 23-30. Your Body mass index is 29.26 kg/m. If this is out of the aforementioned range listed, please consider follow up with your Primary Care Provider.  If you are age 67 or younger, your body mass index should be between 19-25. Your Body mass index is 29.26 kg/m. If this is out of the aformentioned range listed, please consider follow up with your Primary Care Provider.   MY CHART:  The [facility] providers would like to encourage you to use [facility] to communicate with providers for non-urgent requests or questions.  Due to long hold times on the telephone, sending your provider a message by [facility] may be a faster and more efficient way to get a response.  Please allow 48 business hours for a response.  Please remember that this is for non-urgent requests.   Thank you for trusting me with your  gastrointestinal care!    Noralyn Pick, CRNP

## 2021-03-22 NOTE — Progress Notes (Signed)
03/22/2021 [name] [id number] 1955-11-20   CHIEF COMPLAINT: Right-sided pain, abdominal bloat  HISTORY OF PRESENT ILLNESS: Juanita Craver. 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  She presents to our office today for further evaluation regarding chronic right flank pain with intermittent abdominal gas bloat discomfort. She complains of having intermittent right flank pain since September 2022.  At that time, [facility] and she underwent a renal stone CT which showed diverticulosis without evidence of diverticulitis and nonobstructing kidney stones.  Her stress level was significantly elevated at that time as well.  Since then, she has intermittent right flank achy pain that does not really go away, she just notices it more at different times.  No noticeable flank or abdominal pain at this time.  She has intermittent constipation, she describes passing small multiple stools daily and does not feel emptied.  She occasionally has diarrhea every 2 hours throughout most of the day every 2 to 4 weeks since she had her gallbladder removed in 1997.  No bloody diarrhea.  No rectal bleeding or black stools.  No recent antibiotics.  No fevers, sweats or chills.  No weight loss.  Her most recent colonoscopy was 11/20/2016, 3 tubular adenomatous/hyperplastic polyps were removed from the descending colon, sigmoid and rectum.  Diverticulosis to the left and right colon were also noted.  She is due for repeat colonoscopy 10/2021.  Her maternal grandmother died from colon cancer in her 63s.  Colonoscopy 11/20/2016 by Dr. Scarlette Shorts: - The examined portion of the ileum was normal. - Three 1 to 5 mm polyps in the rectum, in the sigmoid colon and in the descending colon, removed with a cold snare. Resected and retrieved. - Diverticulosis in the  left colon and in the right colon. - 5 year recall 1. Surgical [P], descending, polyp - TUBULAR ADENOMA. - NO HIGH GRADE DYSPLASIA OR MALIGNANCY. 2. Surgical [P], sigmoid, polyp - HYPERPLASTIC POLYP. - NO DYSPLASIA OR MALIGNANCY. 3. Surgical [P], rectum, polyp - TUBULAR ADENOMA (X3 FRAGMENTS). - NO HIGH GRADE DYSPLASIA OR MALIGNANCY   Past Medical History:  Diagnosis Date   Allergy    Carotid atherosclerosis, bilateral    COVID-19    Diverticulitis    Diverticulosis    Gallstones    GERD (gastroesophageal reflux disease)    H/O blood clots    History of kidney stones    History of TIAs    4 possible TIAs in 11/11.  Worked up by [facility] neurology.  Apparently had echo with small PFO. Venous ultrasound with no lower extremity DVT.  Holter (11/11): HR range 47-127, average 74, rare PACs and PVCs, no atrial fibrillation.       Hypertriglyceridemia    Knee osteoarthritis    Right   Mixed hyperlipidemia    Nephrolithiasis 2003   PFO (patent foramen ovale)    Past Surgical History:  Procedure Laterality Date   ABDOMINAL HYSTERECTOMY     CESAREAN SECTION     x 2   CHOLECYSTECTOMY     EXTRACORPOREAL SHOCK WAVE LITHOTRIPSY Left 09/02/2018   Procedure: EXTRACORPOREAL SHOCK WAVE LITHOTRIPSY (ESWL);  Surgeon: Alexis Frock, MD;  Location: [facility];  Service: Urology;  Laterality: Left;   SHOULDER ARTHROSCOPY Left 12/09/2010   TONSILLECTOMY     WISDOM TOOTH EXTRACTION     Social  History: She is married.  She has 2 sons.  Nonsmoker. She drinks 1 beer weekly. No drug use.   Family History:  Maternal grandmother died from colon cancer in her 19's.  Father with heart disease and DVT.  Mother with COPD.  Allergies  Allergen Reactions   Evolocumab     Other reaction(s): Myalgias (Muscle Pain)   Bactrim [Sulfamethoxazole-Trimethoprim] Itching   Codeine     REACTION: nausea   Ivp Dye [Iodinated Contrast Media] Hives   Macrobid [Nitrofurantoin Macrocrystal] Nausea And Vomiting    Morphine     REACTION: nausea   Penicillins Hives    Has patient had a PCN reaction causing immediate rash, facial/tongue/throat swelling, SOB or lightheadedness with hypotension: Yes Has patient had a PCN reaction causing severe rash involving mucus membranes or skin necrosis: No Has patient had a PCN reaction that required hospitalization: No Has patient had a PCN reaction occurring within the last 10 years: No If all of the above answers are "NO", then may proceed with Cephalosporin use.   This reaction was 30 years ago. Has had amoxicillan    Adhesive [Tape] Itching and Rash   Levaquin [Levofloxacin In D5w] Palpitations    "Made my heart feel like it was coming out of my chest" per pt.   Lipitor [Atorvastatin Calcium] Other (See Comments)    Muscle aches   Zocor [Simvastatin - High Dose] Other (See Comments)    Muscle aches      Outpatient Encounter Medications as of 03/22/2021  Medication Sig   acetaminophen (TYLENOL) 500 MG tablet Take 1,000 mg by mouth as needed.   cholecalciferol (VITAMIN D) 1000 UNITS tablet Take 1,000 Units by mouth daily.   clopidogrel (PLAVIX) 75 MG tablet Take 75 mg by mouth daily.   fish oil-omega-3 fatty acids 1000 MG capsule Take 2 g by mouth daily.    fluticasone (FLONASE) 50 MCG/ACT nasal spray Place 2 sprays into both nostrils daily as needed.    Garlic 696 MG CAPS Take 1 capsule by mouth daily.   Multiple Vitamin (MULTIVITAMIN) tablet Take 1 tablet by mouth daily.   pantoprazole (PROTONIX) 40 MG tablet Take 40 mg by mouth daily.    PRALUENT 75 MG/ML SOPN Inject 1 mL into the skin every 14 (fourteen) days.    Probiotic Product (PROBIOTIC ADVANCED) CAPS Take by mouth every other day.    TURMERIC PO Take 1 capsule by mouth daily.   No facility-administered encounter medications on file as of 03/22/2021.   REVIEW OF SYSTEMS:  Gen: Denies fever, sweats or chills. No weight loss.  CV: + Leg swelling. Denies chest pain or palpitations. Resp: Denies  cough, shortness of breath of hemoptysis.  GI: See HPI.  No GERD symptoms.   GU : Denies urinary burning, blood in urine, increased urinary frequency or incontinence. MS: + Arthritis. Derm: Denies rash, itchiness, skin lesions or unhealing ulcers. Psych: Denies depression, anxiety or memory loss. Heme: Denies bruising, easy bleeding. Neuro:  Denies headaches, dizziness or paresthesias. Endo:  Denies any problems with DM, thyroid or adrenal function.  PHYSICAL EXAM: BP 132/84 (BP Location: Left Arm, Patient Position: Sitting, Cuff Size: Normal)    Pulse 72    Ht 5' 1.25" (1.556 m) Comment: height measured without shoes   Wt 156 lb 2 oz (70.8 kg)    BMI 29.26 kg/m   General: 66 year old female in no acute distress. Head: Normocephalic and atraumatic. Eyes:  Sclerae non-icteric, conjunctive pink. Ears: Normal auditory acuity. Mouth: Dentition  intact. No ulcers or lesions.  Neck: Supple, no lymphadenopathy or thyromegaly.  Lungs: Clear bilaterally to auscultation without wheezes, crackles or rhonchi. Heart: Regular rate and rhythm. No murmur, rub or gallop appreciated.  Abdomen: Soft, nontender, non distended. No masses. No hepatosplenomegaly. Normoactive bowel sounds x 4 quadrants.  No CVA tenderness. Rectal: Deferred. Musculoskeletal: Symmetrical with no gross deformities. Skin: Warm and dry. No rash or lesions on visible extremities. Extremities: No edema. Neurological: Alert oriented x 4, no focal deficits.  Psychological:  Alert and cooperative. Normal mood and affect.  ASSESSMENT AND PLAN:  2) 66 year old female with chronic right flank pain.  No severe pain. -Continue follow-up with urology -Consider CTAP with oral and IV contrast if right flank pain worsens  2) Constipation with associated abdominal gas bloat. -IBgard 1 p.o. twice daily -Benefiber 1 tablespoon daily, stop if abdominal bloat worsens -CBC and CMP  3) Intermittent days of diarrhea, no specific food or stress  triggers -Patient advised to record her dietary intake the day she has diarrhea  4) History of 3 tubular adenomatous/hyperplastic polyps removed from the colon per colonoscopy 10/2016.  Maternal grandmother with history of colon cancer in her 36s. -Next colonoscopy due 10/2021  5) History of TIAs, she follows with neurology at [facility] and continues on Plavix  Patient to follow-up in the office in 2 months   CC:  Marylee Floras, FNP

## 2021-03-23 NOTE — Progress Notes (Signed)
Assessment and plan as noted 

## 2021-06-07 NOTE — Progress Notes (Signed)
? ? ?Cardiology Office Note ? ?Date: 06/08/2021  ? ?ID: Tiffany Horton, DOB November 20, 1955, MRN [id number] ? ?PCP:  Marylee Floras, FNP  ?Cardiologist:  Rozann Lesches, MD ?Electrophysiologist:  None  ? ?Chief Complaint  ?Patient presents with  ? Cardiac follow-up  ? ? ?History of Present Illness: ?Tiffany Horton is a 66 y.o. female last seen in March 2022.  She is here for a routine visit.  She states that she has continued on Praluent, had lab work with [facility] in February as noted below, but no recent lipids. ? ?I personally reviewed her ECG today which shows normal sinus rhythm.  She remains on Plavix with history of TIAs.  Does not report any palpitations. ? ?Past Medical History:  ?Diagnosis Date  ? Allergy   ? Carotid atherosclerosis, bilateral   ? COVID-19   ? Diverticulitis   ? Diverticulosis   ? Gallstones   ? GERD (gastroesophageal reflux disease)   ? H/O blood clots   ? History of kidney stones   ? History of TIAs   ? 4 possible TIAs in 11/11.  Worked up by [facility] neurology.  Apparently had echo with small PFO. Venous ultrasound with no lower extremity DVT.  Holter (11/11): HR range 47-127, average 74, rare PACs and PVCs, no atrial fibrillation.      ? Hypertriglyceridemia   ? Knee osteoarthritis   ? Right  ? Mixed hyperlipidemia   ? Nephrolithiasis 2003  ? PFO (patent foramen ovale)   ? ? ?Past Surgical History:  ?Procedure Laterality Date  ? ABDOMINAL HYSTERECTOMY    ? CESAREAN SECTION    ? x 2  ? CHOLECYSTECTOMY    ? EXTRACORPOREAL SHOCK WAVE LITHOTRIPSY Left 09/02/2018  ? Procedure: EXTRACORPOREAL SHOCK WAVE LITHOTRIPSY (ESWL);  Surgeon: Alexis Frock, MD;  Location: [facility];  Service: Urology;  Laterality: Left;  ? SHOULDER ARTHROSCOPY Left 12/09/2010  ? TONSILLECTOMY    ? WISDOM TOOTH EXTRACTION    ? ? ?Current Outpatient Medications  ?Medication Sig Dispense Refill  ? acetaminophen (TYLENOL) 500 MG tablet Take 1,000 mg by mouth as needed.    ? cholecalciferol (VITAMIN D) 1000 UNITS tablet Take 1,000 Units  by mouth daily.    ? clopidogrel (PLAVIX) 75 MG tablet Take 75 mg by mouth daily.    ? fish oil-omega-3 fatty acids 1000 MG capsule Take 2 g by mouth daily.     ? fluticasone (FLONASE) 50 MCG/ACT nasal spray Place 2 sprays into both nostrils daily as needed.     ? Multiple Vitamin (MULTIVITAMIN) tablet Take 1 tablet by mouth daily.    ? pantoprazole (PROTONIX) 40 MG tablet Take 40 mg by mouth daily.     ? PRALUENT 75 MG/ML SOPN Inject 1 mL into the skin every 14 (fourteen) days.     ? Probiotic Product (PROBIOTIC ADVANCED) CAPS Take by mouth every other day.  (Patient not taking: Reported on 06/08/2021)    ? ?No current facility-administered medications for this visit.  ? ?Allergies:  Evolocumab, Bactrim [sulfamethoxazole-trimethoprim], Codeine, Ivp dye [iodinated contrast media], Macrobid [nitrofurantoin macrocrystal], Morphine, Penicillins, Adhesive [tape], Levaquin [levofloxacin in d5w], Lipitor [atorvastatin calcium], and Zocor [simvastatin - high dose]  ? ?ROS: Reports trouble with abdominal bloating. ? ?Physical Exam: ?VS:  BP 140/90   Pulse 74   Ht '5\' 2"'$  (1.575 m)   Wt 157 lb (71.2 kg)   SpO2 96%   BMI 28.72 kg/m? , BMI Body mass index is 28.72 kg/m?. ? ?Wt  Readings from Last 3 Encounters:  ?06/08/21 157 lb (71.2 kg)  ?03/22/21 156 lb 2 oz (70.8 kg)  ?04/14/20 164 lb (74.4 kg)  ?  ?General: Patient appears comfortable at rest. ?HEENT: Conjunctiva and lids normal. ?Neck: Supple, no elevated JVP or carotid bruits, no thyromegaly. ?Lungs: Clear to auscultation, nonlabored breathing at rest. ?Cardiac: Regular rate and rhythm, no S3 or significant systolic murmur, no pericardial rub. ?Extremities: No pitting edema. ? ?ECG:  An ECG dated 04/14/2020 was personally reviewed today and demonstrated:  Sinus rhythm. ? ?Recent Labwork: ?03/22/2021: ALT 18; AST 19; BUN 17; Creatinine, Ser 0.69; Hemoglobin 14.1; Platelets 308.0; Potassium 4.1; Sodium 141  ? ?Other Studies Reviewed Today: ? ?Carotid Dopplers  10/25/2017: ?Final Interpretation:  ?Right Carotid: There was no evidence of thrombus, dissection,  ?atherosclerotic  ?               plaque or stenosis in the cervical carotid system.  ? ?Left Carotid: There was no evidence of thrombus, dissection,  ?atherosclerotic  ?              plaque or stenosis in the cervical carotid system.  ? ?Vertebrals:  Bilateral vertebral arteries demonstrate antegrade flow.  ?Subclavians: Normal flow hemodynamics were seen in bilateral subclavian  ?             arteries.  ?  ?Assessment and Plan: ? ?1.  Familial hyperlipidemia with statin intolerance.  She continues on Praluent, previously following at [facility].  Discussed getting a follow-up lipid profile.  She does not describe any medication intolerances. ? ?2.  History of TIAs, remains on Plavix.  No active palpitations or obvious documented arrhythmias.  ECG is normal today. ? ?Medication Adjustments/Labs and Tests Ordered: ?Current medicines are reviewed at length with the patient today.  Concerns regarding medicines are outlined above.  ? ?Tests Ordered: ?Orders Placed This Encounter  ?Procedures  ? EKG 12-Lead  ? ? ?Medication Changes: ?No orders of the defined types were placed in this encounter. ? ? ?Disposition:  Follow up  1 year. ? ?Signed, ?Satira Sark, MD, [facility] ?06/08/2021 10:37 AM    ?[facility] ?[address], [place], [place] [address] ?Phone: [phone]; Fax: [phone]  ?

## 2021-06-08 ENCOUNTER — Ambulatory Visit (INDEPENDENT_AMBULATORY_CARE_PROVIDER_SITE_OTHER): Payer: PRIVATE HEALTH INSURANCE | Admitting: Cardiology

## 2021-06-08 ENCOUNTER — Encounter: Payer: Self-pay | Admitting: Cardiology

## 2021-06-08 VITALS — BP 140/90 | HR 74 | Ht 62.0 in | Wt 157.0 lb

## 2021-06-08 DIAGNOSIS — Z8673 Personal history of transient ischemic attack (TIA), and cerebral infarction without residual deficits: Secondary | ICD-10-CM | POA: Diagnosis not present

## 2021-06-08 DIAGNOSIS — E7849 Other hyperlipidemia: Secondary | ICD-10-CM | POA: Diagnosis not present

## 2021-06-08 NOTE — Patient Instructions (Signed)
Medication Instructions:  ?Your physician recommends that you continue on your current medications as directed. Please refer to the Current Medication list given to you today. ? ?Labwork: ?Please contact us to have your fasting lipid panel done if its not done by another provider. ? ?Testing/Procedures: ?none ? ?Follow-Up: ?Your physician recommends that you schedule a follow-up appointment in: 1 year. You will receive a reminder call in the mail in about 10 months reminding you to call and schedule your appointment. If you don't receive this call, please contact our office. ? ?Any Other Special Instructions Will Be Listed Below (If Applicable). ? ?If you need a refill on your cardiac medications before your next appointment, please call your pharmacy. ?

## 2021-06-10 ENCOUNTER — Ambulatory Visit (INDEPENDENT_AMBULATORY_CARE_PROVIDER_SITE_OTHER): Payer: PRIVATE HEALTH INSURANCE | Admitting: Nurse Practitioner

## 2021-06-10 ENCOUNTER — Encounter: Payer: Self-pay | Admitting: Nurse Practitioner

## 2021-06-10 VITALS — BP 132/80 | HR 58 | Ht 62.0 in | Wt 154.4 lb

## 2021-06-10 DIAGNOSIS — R140 Abdominal distension (gaseous): Secondary | ICD-10-CM

## 2021-06-10 DIAGNOSIS — R109 Unspecified abdominal pain: Secondary | ICD-10-CM | POA: Diagnosis not present

## 2021-06-10 NOTE — Progress Notes (Signed)
? ? ? ?06/10/2021 ?Tiffany Horton ?[id number] ?1955-04-17 ? ? ?Chief Complaint: Abdominal bloat ? ?History of Present Illness: Tiffany Horton is a 66 year old female with a past medical history of arthritis, hypercholesterolemia, TIA x 4 in 2011 on Plavix, palpitations, PFO, kidney stones, GERD, mid sigmoid diverticulitis 06/2016 and colon polyps.  I saw her in office on 03/22/2021 for further evaluation regarding chronic right flank pain with intermittent abdominal gas bloat discomfort. She was prescribed Benefiber to increase stool output and Ibgard to reduce bloat/gas. She presents today for further follow up. Ibgard intermittently reduced her bloat. Constipation has improved. No rectal bleeding or black stools. She continues to have right flank to pain which lasts for a few hours every few weeks. She's had intermittent right flank pain since September 2022.  History of kidney stones followed by urology. She felt nauseous yesterday which abated after she drank [facility]. No weight loss.  ? ?Renal stone CT 08/26/2018: ?FINDINGS: ?Lower chest:  Trace pericardial fluid and/or thickening ?  ?Hepatobiliary: No focal liver abnormality.Cholecystectomy which ?accounts for mild prominence of the common bile duct. ?  ?Pancreas: Unremarkable. ?  ?Spleen: Unremarkable. ?  ?Adrenals/Urinary Tract: Negative adrenals. Left hydronephrosis, ?renal expansion, and perinephric edema secondary to a UPJ calculus ?measuring 5 x 3 mm. 5 mm right lower pole calculus. Collapsed ?urinary bladder. ?  ?Stomach/Bowel: No obstruction. No appendicitis. Sigmoid ?diverticulosis ?  ?Vascular/Lymphatic: No acute vascular abnormality. No mass or ?adenopathy. ?  ?Reproductive:Hysterectomy. ?  ?Other: No ascites or pneumoperitoneum. ?  ?Musculoskeletal: No acute abnormalities. ?  ?IMPRESSION: ?1. Obstructing 5 x 3 mm left UPJ calculus. ?2. Right nephrolithiasis and colonic diverticulosis. ?   ?  ?Colonoscopy 11/20/2016 by Dr. Scarlette Shorts: ?- The examined  portion of the ileum was normal. ?- Three 1 to 5 mm polyps in the rectum, in the sigmoid colon and in the descending colon, ?removed with a cold snare. Resected and retrieved. ?- Diverticulosis in the left colon and in the right colon. ?- 5 year recall ?1. Surgical [P], descending, polyp ?- TUBULAR ADENOMA. ?- NO HIGH GRADE DYSPLASIA OR MALIGNANCY. ?2. Surgical [P], sigmoid, polyp ?- HYPERPLASTIC POLYP. ?- NO DYSPLASIA OR MALIGNANCY. ?3. Surgical [P], rectum, polyp ?- TUBULAR ADENOMA (X3 FRAGMENTS). ?- NO HIGH GRADE DYSPLASIA OR MALIGNANCY ?  ? ?  Latest Ref Rng & Units 03/22/2021  ? 12:26 PM 08/26/2018  ?  9:09 AM 07/19/2016  ?  7:39 PM  ?CBC  ?WBC 4.0 - 10.5 K/uL 7.5   16.2   12.5    ?Hemoglobin 12.0 - 15.0 g/dL 14.1   14.6   12.7    ?Hematocrit 36.0 - 46.0 % 41.4   43.9   38.1    ?Platelets 150.0 - 400.0 K/uL 308.0   291   264    ?  ? ?  Latest Ref Rng & Units 03/22/2021  ? 12:26 PM 08/26/2018  ?  9:09 AM 10/04/2016  ?  3:28 PM  ?CMP  ?Glucose 70 - 99 mg/dL 90   124     ?BUN 6 - 23 mg/dL 17   26     ?Creatinine 0.40 - 1.20 mg/dL 0.69   0.93     ?Sodium 135 - 145 mEq/L 141   143     ?Potassium 3.5 - 5.1 mEq/L 4.1   3.8     ?Chloride 96 - 112 mEq/L 105   109     ?CO2 19 - 32 mEq/L 32  24     ?Calcium 8.4 - 10.5 mg/dL 10.2   9.7     ?Total Protein 6.0 - 8.3 g/dL 7.5   7.8   7.0    ?Total Bilirubin 0.2 - 1.2 mg/dL 0.8   1.1   0.6    ?Alkaline Phos 39 - 117 U/L 56   68   73    ?AST 0 - 37 U/L 19   27   45    ?ALT 0 - 35 U/L 18   26   67    ?   ? ?Past Medical History:  ?Diagnosis Date  ? Allergy   ? Carotid atherosclerosis, bilateral   ? COVID-19   ? Diverticulitis   ? Diverticulosis   ? Gallstones   ? GERD (gastroesophageal reflux disease)   ? H/O blood clots   ? History of kidney stones   ? History of TIAs   ? 4 possible TIAs in 11/11.  Worked up by [facility] neurology.  Apparently had echo with small PFO. Venous ultrasound with no lower extremity DVT.  Holter (11/11): HR range 47-127, average 74, rare PACs and PVCs, no atrial  fibrillation.      ? Hypertriglyceridemia   ? Knee osteoarthritis   ? Right  ? Mixed hyperlipidemia   ? Nephrolithiasis 2003  ? PFO (patent foramen ovale)   ? ?Past Surgical History:  ?Procedure Laterality Date  ? ABDOMINAL HYSTERECTOMY    ? CESAREAN SECTION    ? x 2  ? CHOLECYSTECTOMY    ? EXTRACORPOREAL SHOCK WAVE LITHOTRIPSY Left 09/02/2018  ? Procedure: EXTRACORPOREAL SHOCK WAVE LITHOTRIPSY (ESWL);  Surgeon: Alexis Frock, MD;  Location: [facility];  Service: Urology;  Laterality: Left;  ? SHOULDER ARTHROSCOPY Left 12/09/2010  ? TONSILLECTOMY    ? WISDOM TOOTH EXTRACTION    ? ?Current Outpatient Medications on File Prior to Visit  ?Medication Sig Dispense Refill  ? acetaminophen (TYLENOL) 500 MG tablet Take 1,000 mg by mouth as needed.    ? cholecalciferol (VITAMIN D) 1000 UNITS tablet Take 1,000 Units by mouth daily.    ? clopidogrel (PLAVIX) 75 MG tablet Take 75 mg by mouth daily.    ? fish oil-omega-3 fatty acids 1000 MG capsule Take 2 g by mouth daily.     ? fluticasone (FLONASE) 50 MCG/ACT nasal spray Place 2 sprays into both nostrils daily as needed.     ? Multiple Vitamin (MULTIVITAMIN) tablet Take 1 tablet by mouth daily.    ? pantoprazole (PROTONIX) 40 MG tablet Take 40 mg by mouth daily.     ? PRALUENT 75 MG/ML SOPN Inject 1 mL into the skin every 14 (fourteen) days.     ? Probiotic Product (PROBIOTIC ADVANCED) CAPS Take by mouth every other day.  (Patient not taking: Reported on 06/10/2021)    ? ?No current facility-administered medications on file prior to visit.  ? ?Allergies  ?Allergen Reactions  ? Evolocumab   ?  Other reaction(s): Myalgias (Muscle Pain)  ? Bactrim [Sulfamethoxazole-Trimethoprim] Itching  ? Codeine   ?  REACTION: nausea  ? Ivp Dye [Iodinated Contrast Media] Hives  ? Macrobid [Nitrofurantoin Macrocrystal] Nausea And Vomiting  ? Morphine   ?  REACTION: nausea  ? Penicillins Hives  ?  Has patient had a PCN reaction causing immediate rash, facial/tongue/throat swelling, SOB or  lightheadedness with hypotension: Yes ?Has patient had a PCN reaction causing severe rash involving mucus membranes or skin necrosis: No ?Has patient had a PCN reaction that required  hospitalization: No ?Has patient had a PCN reaction occurring within the last 10 years: No ?If all of the above answers are "NO", then may proceed with Cephalosporin use. ? ? ?This reaction was 30 years ago. Has had amoxicillan ?  ? Adhesive [Tape] Itching and Rash  ? Levaquin [Levofloxacin In D5w] Palpitations  ?  "Made my heart feel like it was coming out of my chest" per pt.  ? Lipitor [Atorvastatin Calcium] Other (See Comments)  ?  Muscle aches  ? Zocor [Simvastatin - High Dose] Other (See Comments)  ?  Muscle aches  ? ? ?Current Medications, Allergies, Past Medical History, Past Surgical History, Family History and Social History were reviewed in [employer] record. ? ?Review of Systems:   ?Constitutional: Negative for fever, sweats, chills or weight loss.  ?Respiratory: Negative for shortness of breath.   ?Cardiovascular: Negative for chest pain, palpitations and leg swelling.  ?Gastrointestinal: See HPI.  ?Musculoskeletal: Negative for back pain or muscle aches.  ?Neurological: Negative for dizziness, headaches or paresthesias.  ? ?Physical Exam: ?BP 132/80   Pulse (!) 58   Ht '5\' 2"'$  (1.575 m)   Wt 154 lb 6.4 oz (70 kg)   SpO2 98%   BMI 28.24 kg/m?  ? ?Wt Readings from Last 3 Encounters:  ?06/10/21 154 lb 6.4 oz (70 kg)  ?06/08/21 157 lb (71.2 kg)  ?03/22/21 156 lb 2 oz (70.8 kg)  ?  ?General: 66 year old female in NAD. ?Head: Normocephalic and atraumatic. ?Eyes: No scleral icterus. Conjunctiva pink . ?Ears: Normal auditory acuity. ?Mouth: Dentition intact. No ulcers or lesions.  ?Lungs: Clear throughout to auscultation. ?Heart: Regular rate and rhythm, no murmur. ?Abdomen: Soft, nontender and nondistended. No masses or hepatomegaly. Normal bowel sounds x 4 quadrants. Lower midline scar intact.   ?Rectal: Deferred.  ?Musculoskeletal: Symmetrical with no gross deformities. ?Extremities: No edema. ?Neurological: Alert oriented x 4. No focal deficits.  ?Psychological: Alert and cooperative. Normal mood and affect ? ?[facility]

## 2021-06-10 NOTE — Patient Instructions (Signed)
RECOMMENDATIONS: ? ?Benefiber- 1 tablespoon daily. ?Miralax- Dissolve one capful in 8 ounces of water and drink before bed to increase stool output if tolerated. ?Next colonoscopy is due October 2023. You will get a recall notice as it gets closer to October. ? ?You have been given a testing kit to check for small intestine bacterial overgrowth (SIBO) which is completed by a company named [facility].  ? ?Make sure to return your test in the mail using the return mailing label given to you along with the kit. Your demographic and insurance information have already been sent to the company and they should be in contact with you over the next week regarding this test.  ? ?[facility] will collect an upfront charge of $99.74 for commercial insurance plans and $209.74 is you are paying cash. Make sure to discuss with [facility] PRIOR to having the test if they have gotten informatoin from your insurance company as to how much your testing will cost out of pocket, if any. Please keep in mind that you will be getting a call from phone number [phone] or a similar number. If you do not hear from them within this time frame, please call our office at [phone].  ? ?Thank you for trusting me with your gastrointestinal care!   ? ?Noralyn Pick, CRNP ? ? ? ?BMI: ? ?If you are age 66 or older, your body mass index should be between 23-30. Your Body mass index is 28.24 kg/m?Marland Kitchen If this is out of the aforementioned range listed, please consider follow up with your Primary Care Provider. ? ?If you are age 5 or younger, your body mass index should be between 19-25. Your Body mass index is 28.24 kg/m?Marland Kitchen If this is out of the aformentioned range listed, please consider follow up with your Primary Care Provider.  ? ?MY CHART: ? ?The [facility] providers would like to encourage you to use [facility] to communicate with providers for non-urgent requests or questions.  Due to long hold times on the telephone,  sending your provider a message by [facility] may be a faster and more efficient way to get a response.  Please allow 48 business hours for a response.  Please remember that this is for non-urgent requests.  ? ?

## 2021-06-11 NOTE — Progress Notes (Signed)
Noted  

## 2021-11-22 ENCOUNTER — Encounter: Payer: Self-pay | Admitting: Internal Medicine

## 2023-01-18 ENCOUNTER — Encounter: Payer: Self-pay | Admitting: Cardiology

## 2023-01-18 ENCOUNTER — Ambulatory Visit: Payer: PRIVATE HEALTH INSURANCE | Attending: Cardiology | Admitting: Cardiology

## 2023-01-18 VITALS — BP 132/84 | HR 71 | Ht 62.0 in | Wt 159.0 lb

## 2023-01-18 DIAGNOSIS — R030 Elevated blood-pressure reading, without diagnosis of hypertension: Secondary | ICD-10-CM | POA: Diagnosis not present

## 2023-01-18 DIAGNOSIS — Z136 Encounter for screening for cardiovascular disorders: Secondary | ICD-10-CM

## 2023-01-18 DIAGNOSIS — Z8673 Personal history of transient ischemic attack (TIA), and cerebral infarction without residual deficits: Secondary | ICD-10-CM | POA: Diagnosis not present

## 2023-01-18 DIAGNOSIS — E7849 Other hyperlipidemia: Secondary | ICD-10-CM

## 2023-01-18 NOTE — Progress Notes (Signed)
    Cardiology Office Note  Date: 01/18/2023   ID: [name], DOB October 31, 1955, MRN [id number]  History of Present Illness: Tiffany Horton is a 67 y.o. female last seen in May 2023.  She is here for a follow-up visit.  Continues to work full-time, enjoys her job with the [employer] in [place].  She does not report any palpitations, no exertional chest pain, no neurological symptoms.  I reviewed her medications, she continues on Praluent, omega-3 supplements, and Plavix.  Interval follow-up visits noted at [facility].  I reviewed her lab work.  I reviewed her ECG today which shows sinus rhythm with prolonged PR interval.  Physical Exam: VS:  BP 132/84   Pulse 71   Ht 5\' 2"  (1.575 m)   Wt 159 lb (72.1 kg)   SpO2 94%   BMI 29.08 kg/m , BMI Body mass index is 29.08 kg/m.  Wt Readings from Last 3 Encounters:  01/18/23 159 lb (72.1 kg)  06/10/21 154 lb 6.4 oz (70 kg)  06/08/21 157 lb (71.2 kg)    General: Patient appears comfortable at rest. HEENT: Conjunctiva and lids normal. Neck: Supple, no elevated JVP or carotid bruits. Lungs: Clear to auscultation, nonlabored breathing at rest. Cardiac: Regular rate and rhythm, no S3 or significant systolic murmur.  ECG:  An ECG dated 06/08/2021 was personally reviewed today and demonstrated:  Sinus rhythm.  Labwork:  May 2024: Hemoglobin 13.3, platelets 300, BUN 16, creatinine 0.67, potassium 4.6, AST 21, ALT 19 October 2022: Cholesterol 144, triglycerides 149, HDL 54, LDL 65, magnesium 2.2  Other Studies Reviewed Today:  No interval cardiac testing for review today.  Assessment and Plan:  1.  History of familial hyperlipidemia with statin myalgias.  She continues to follow at [facility] and has done well on Praluent.  LDL 65 in September.  2.  History of TIAs in 2011, also followed at [facility].  She continues on Plavix along with lipid management including omega-3 supplements and Praluent.  She has had no interval neurological symptoms.   Echocardiogram from 2017 did not reveal any obvious PFO in the absence of contrast, although apparently previously documented.  Carotid Dopplers were normal in 2019.  3.  Mild blood pressure elevation noted today.  No standing history of primary hypertension.  Would follow with PCP.  Disposition:  Follow up  1 year.  Signed, Jonelle Sidle, M.D., F.A.C.C. [facility] at [facility]

## 2023-01-18 NOTE — Patient Instructions (Signed)

## 2024-01-09 ENCOUNTER — Encounter: Payer: Self-pay | Admitting: Cardiology

## 2024-01-09 ENCOUNTER — Ambulatory Visit: Payer: PRIVATE HEALTH INSURANCE | Attending: Cardiology | Admitting: Cardiology

## 2024-01-09 VITALS — BP 145/90 | HR 65 | Ht 62.0 in | Wt 160.8 lb

## 2024-01-09 DIAGNOSIS — E7849 Other hyperlipidemia: Secondary | ICD-10-CM | POA: Diagnosis not present

## 2024-01-09 DIAGNOSIS — R030 Elevated blood-pressure reading, without diagnosis of hypertension: Secondary | ICD-10-CM | POA: Diagnosis not present

## 2024-01-09 DIAGNOSIS — Z8673 Personal history of transient ischemic attack (TIA), and cerebral infarction without residual deficits: Secondary | ICD-10-CM | POA: Diagnosis not present

## 2024-01-09 NOTE — Patient Instructions (Addendum)
 Medication Instructions:  Your physician recommends that you continue on your current medications as directed. Please refer to the Current Medication list given to you today.  Labwork: none  Testing/Procedures: none  Follow-Up: Your physician recommends that you schedule a follow-up appointment in: 1 year. You will receive a reminder call in about 8-10 months reminding you to schedule your appointment. If you don't receive this call, please contact our office.  Any Other Special Instructions Will Be Listed Below (If Applicable). Please purchase a blood pressure monitor. Your physician has requested that you regularly monitor and record your blood pressure readings at home. Please use the same machine at the same time of day to check your readings and record them to bring to your nurse visit.  If you need a refill on your cardiac medications before your next appointment, please call your pharmacy.

## 2024-01-09 NOTE — Progress Notes (Signed)
° ° °  Cardiology Office Note  Date: 01/09/2024   ID: Tiffany Horton, Tiffany Horton [birth year], MRN [id number]  History of Present Illness: Tiffany Horton is a 68 y.o. female last seen in December 2024.  She is here for a routine visit.  Reports no palpitations, no focal neurological symptoms.  She was having some headaches back in August, but these have resolved.  She had a telehealth encounter with Neurology at [facility] in November, I reviewed the note.  She did have an interval brain MRI that did not show any obvious acute findings.  We went over her medications today.  She reports compliance with both Praluent and Plavix.  Most recent follow-up lipid panel showed LDL 58.  Blood pressure was elevated today, rechecked by me at 145/90.  Her blood pressure was 133/84 in October at Neurology visit at [facility].  I reviewed her ECG today which shows normal sinus rhythm.  Physical Exam: VS:  BP (!) 145/90 (BP Location: Right Arm, Cuff Size: Normal)   Pulse 65   Ht 5' 2 (1.575 m)   Wt 160 lb 12.8 oz (72.9 kg)   BMI 29.41 kg/m , BMI Body mass index is 29.41 kg/m.  Wt Readings from Last 3 Encounters:  01/09/24 160 lb 12.8 oz (72.9 kg)  01/18/23 159 lb (72.1 kg)  06/10/21 154 lb 6.4 oz (70 kg)    General: Patient appears comfortable at rest. HEENT: Conjunctiva and lids normal. Neck: Supple, no elevated JVP or carotid bruits. Lungs: Clear to auscultation, nonlabored breathing at rest. Cardiac: Regular rate and rhythm, no S3 or significant systolic murmur. Extremities: No pitting edema.  ECG:  An ECG dated 01/18/2023 was personally reviewed today and demonstrated:  Sinus rhythm with prolonged PR interval.  Labwork:  September 2025: Hemoglobin 13.1, platelets 257, BUN 14, creatinine 0.75, GFR 87, potassium 4.5, AST 22, ALT 19, cholesterol 135, triglycerides 114, HDL 57, LDL 58  Other Studies Reviewed Today:  No interval cardiac testing for review today.  Assessment and Plan:  1.  History of familial  hyperlipidemia with statin myalgias.  LDL of 58 and HDL 57 in September.  Continue Praluent 150 mg every 14 days.   2.  History of TIAs status post extensive previous workup and followed by neurology at [facility].  She had a follow-up brain MRI recently in November that did not show any acute findings.  Echocardiogram at [facility] in 2011 indicated evidence of interatrial shunting/PFO, repair not specifically recommended.  Follow-up echocardiogram in 2017 did not reveal any obvious large degree of atrial shunting in the absence of contrast.  She is symptomatically stable and remains on Plavix 75 mg daily.  3.  Elevated blood pressure on recheck, primary hypertension suspected.  I have asked her to get an automatic home blood pressure cuff for home measurements and then we will schedule a nurse visit for recheck.  Can consider starting low-dose Cozaar if necessary.  Disposition:  Follow up 1 year.  Signed, Jayson JUDITHANN Sierras, M.D., F.A.C.C. [facility] at [facility]

## 2024-01-22 ENCOUNTER — Ambulatory Visit: Payer: PRIVATE HEALTH INSURANCE | Attending: Cardiology | Admitting: *Deleted

## 2024-01-22 VITALS — BP 116/72 | HR 85

## 2024-01-22 DIAGNOSIS — I10 Essential (primary) hypertension: Secondary | ICD-10-CM

## 2024-01-22 DIAGNOSIS — R030 Elevated blood-pressure reading, without diagnosis of hypertension: Secondary | ICD-10-CM

## 2024-01-22 NOTE — Progress Notes (Signed)
 Patient in office for nurse BP check.   116/72  85  97%   Patient did not bring her monitor for comparison.    She did bring in BP log & will have scanned into epic for provider review.

## 2024-01-23 ENCOUNTER — Ambulatory Visit: Payer: Self-pay | Admitting: Cardiology
